# Patient Record
Sex: Male | Born: 1990 | Race: Black or African American | Hispanic: No | Marital: Single | Smoking: Current every day smoker
Health system: Southern US, Community
[De-identification: ages and names within clinical notes are randomized; demographics above are authoritative.]

---

## 2014-04-01 ENCOUNTER — Emergency Department (HOSPITAL_COMMUNITY): Payer: BC Managed Care – PPO | Admitting: Anesthesiology

## 2014-04-01 ENCOUNTER — Encounter (HOSPITAL_COMMUNITY)
Admission: EM | Disposition: A | Discharge status: Home / Self Care | Payer: Self-pay | Source: Home / Self Care | Attending: Emergency Medicine

## 2014-04-01 ENCOUNTER — Emergency Department (HOSPITAL_COMMUNITY): Payer: BC Managed Care – PPO

## 2014-04-01 ENCOUNTER — Encounter (HOSPITAL_COMMUNITY): Payer: Self-pay | Admitting: Emergency Medicine

## 2014-04-01 ENCOUNTER — Observation Stay (HOSPITAL_COMMUNITY)
Admission: EM | Admit: 2014-04-01 | Discharge: 2014-04-02 | Disposition: A | Discharge status: Home / Self Care | Payer: BC Managed Care – PPO | Attending: General Surgery | Admitting: General Surgery

## 2014-04-01 DIAGNOSIS — S31109A Unspecified open wound of abdominal wall, unspecified quadrant without penetration into peritoneal cavity, initial encounter: Secondary | ICD-10-CM | POA: Diagnosis not present

## 2014-04-01 DIAGNOSIS — T1490 Injury, unspecified: Secondary | ICD-10-CM | POA: Diagnosis present

## 2014-04-01 DIAGNOSIS — Y929 Unspecified place or not applicable: Secondary | ICD-10-CM | POA: Diagnosis not present

## 2014-04-01 DIAGNOSIS — Z720 Tobacco use: Secondary | ICD-10-CM | POA: Insufficient documentation

## 2014-04-01 DIAGNOSIS — S31139A Puncture wound of abdominal wall without foreign body, unspecified quadrant without penetration into peritoneal cavity, initial encounter: Secondary | ICD-10-CM

## 2014-04-01 DIAGNOSIS — W3400XA Accidental discharge from unspecified firearms or gun, initial encounter: Secondary | ICD-10-CM | POA: Diagnosis not present

## 2014-04-01 DIAGNOSIS — T1490XA Injury, unspecified, initial encounter: Secondary | ICD-10-CM

## 2014-04-01 HISTORY — PX: DIAGNOSTIC LAPAROSCOPIC LIVER BIOPSY: SHX5797

## 2014-04-01 HISTORY — PX: PROCTOSCOPY: SHX2266

## 2014-04-01 LAB — COMPREHENSIVE METABOLIC PANEL
ALK PHOS: 91 U/L (ref 39–117)
ALT: 75 U/L — ABNORMAL HIGH (ref 0–53)
ANION GAP: 18 — AB (ref 5–15)
AST: 51 U/L — ABNORMAL HIGH (ref 0–37)
Albumin: 3.8 g/dL (ref 3.5–5.2)
BILIRUBIN TOTAL: 0.3 mg/dL (ref 0.3–1.2)
BUN: 14 mg/dL (ref 6–23)
CO2: 22 mEq/L (ref 19–32)
Calcium: 9.1 mg/dL (ref 8.4–10.5)
Chloride: 100 mEq/L (ref 96–112)
Creatinine, Ser: 1.22 mg/dL (ref 0.50–1.35)
GFR calc Af Amer: >90 mL/min (ref >90)
GFR calc non Af Amer: 84 mL/min — ABNORMAL LOW (ref >90)
Glucose, Bld: 99 mg/dL (ref 70–99)
POTASSIUM: 3.5 meq/L — AB (ref 3.7–5.3)
Sodium: 140 mEq/L (ref 137–147)
Total Protein: 7.6 g/dL (ref 6.0–8.3)

## 2014-04-01 LAB — PREPARE FRESH FROZEN PLASMA
UNIT DIVISION: 0
Unit division: 0

## 2014-04-01 LAB — ABO/RH: ABO/RH(D): B POS

## 2014-04-01 LAB — ETHANOL: ALCOHOL ETHYL (B): 43 mg/dL — AB (ref 0–11)

## 2014-04-01 LAB — CBC
HCT: 44.6 % (ref 39.0–52.0)
Hemoglobin: 15.1 g/dL (ref 13.0–17.0)
MCH: 28.5 pg (ref 26.0–34.0)
MCHC: 33.9 g/dL (ref 30.0–36.0)
MCV: 84.3 fL (ref 78.0–100.0)
Platelets: 364 10*3/uL (ref 150–400)
RBC: 5.29 MIL/uL (ref 4.22–5.81)
RDW: 13.6 % (ref 11.5–15.5)
WBC: 10 10*3/uL (ref 4.0–10.5)

## 2014-04-01 LAB — PREPARE RBC (CROSSMATCH)

## 2014-04-01 LAB — PROTIME-INR
INR: 1 (ref 0.00–1.49)
PROTHROMBIN TIME: 13.3 s (ref 11.6–15.2)

## 2014-04-01 SURGERY — BIOPSY, LIVER, LAPAROSCOPIC
Anesthesia: General

## 2014-04-01 MED ORDER — SODIUM CHLORIDE 0.9 % IV SOLN
INTRAVENOUS | Status: DC | PRN
  Administered 2014-04-01 (×3): via INTRAVENOUS

## 2014-04-01 MED ORDER — LIDOCAINE HCL (CARDIAC) 20 MG/ML IV SOLN
INTRAVENOUS | Status: DC | PRN
  Administered 2014-04-01: 100 mg via INTRAVENOUS

## 2014-04-01 MED ORDER — KCL IN DEXTROSE-NACL 20-5-0.45 MEQ/L-%-% IV SOLN
INTRAVENOUS | Status: DC
Start: 2014-04-01 — End: 2014-04-02
  Administered 2014-04-01: 11:00:00 via INTRAVENOUS
  Filled 2014-04-01 (×4): qty 1000

## 2014-04-01 MED ORDER — HYDROMORPHONE HCL 1 MG/ML IJ SOLN
INTRAMUSCULAR | Status: AC
  Filled 2014-04-01: qty 1

## 2014-04-01 MED ORDER — FENTANYL CITRATE 0.05 MG/ML IJ SOLN
INTRAMUSCULAR | Status: AC
  Filled 2014-04-01: qty 5

## 2014-04-01 MED ORDER — DEXAMETHASONE SODIUM PHOSPHATE 4 MG/ML IJ SOLN
INTRAMUSCULAR | Status: AC
  Filled 2014-04-01: qty 2

## 2014-04-01 MED ORDER — ONDANSETRON HCL 4 MG PO TABS: 4.0000 mg | ORAL_TABLET | Freq: Four times a day (QID) | ORAL | Status: DC | PRN

## 2014-04-01 MED ORDER — ENOXAPARIN SODIUM 40 MG/0.4ML ~~LOC~~ SOLN
40.0000 mg | SUBCUTANEOUS | Status: DC
  Administered 2014-04-01: 40 mg via SUBCUTANEOUS
  Filled 2014-04-01 (×2): qty 0.4

## 2014-04-01 MED ORDER — NEOSTIGMINE METHYLSULFATE 10 MG/10ML IV SOLN
INTRAVENOUS | Status: DC | PRN
  Administered 2014-04-01: 5 mg via INTRAVENOUS

## 2014-04-01 MED ORDER — TETANUS-DIPHTH-ACELL PERTUSSIS 5-2.5-18.5 LF-MCG/0.5 IM SUSP
INTRAMUSCULAR | Status: AC
  Filled 2014-04-01: qty 0.5

## 2014-04-01 MED ORDER — OXYCODONE-ACETAMINOPHEN 5-325 MG PO TABS: 1.0000 | ORAL_TABLET | ORAL | Status: DC | PRN

## 2014-04-01 MED ORDER — SODIUM CHLORIDE 0.9 % IV BOLUS (SEPSIS)
1000.0000 mL | Freq: Once | INTRAVENOUS | Status: AC
  Administered 2014-04-01: 1000 mL via INTRAVENOUS

## 2014-04-01 MED ORDER — MIDAZOLAM HCL 2 MG/2ML IJ SOLN
INTRAMUSCULAR | Status: AC
  Filled 2014-04-01: qty 2

## 2014-04-01 MED ORDER — GLYCOPYRROLATE 0.2 MG/ML IJ SOLN
INTRAMUSCULAR | Status: DC | PRN
  Administered 2014-04-01: 0.6 mg via INTRAVENOUS

## 2014-04-01 MED ORDER — FENTANYL CITRATE 0.05 MG/ML IJ SOLN
INTRAMUSCULAR | Status: DC | PRN
  Administered 2014-04-01: 50 ug via INTRAVENOUS
  Administered 2014-04-01 (×2): 100 ug via INTRAVENOUS
  Administered 2014-04-01: 50 ug via INTRAVENOUS
  Administered 2014-04-01 (×2): 100 ug via INTRAVENOUS

## 2014-04-01 MED ORDER — PHENYLEPHRINE HCL 10 MG/ML IJ SOLN
10.0000 mg | INTRAMUSCULAR | Status: DC | PRN
  Administered 2014-04-01: 15 ug/min via INTRAVENOUS

## 2014-04-01 MED ORDER — SODIUM CHLORIDE 0.9 % IV SOLN
1.0000 g | Freq: Once | INTRAVENOUS | Status: AC
  Administered 2014-04-01: 1 g via INTRAVENOUS
  Filled 2014-04-01 (×2): qty 1

## 2014-04-01 MED ORDER — BUPIVACAINE HCL (PF) 0.25 % IJ SOLN
INTRAMUSCULAR | Status: DC | PRN
  Administered 2014-04-01: 10 mL

## 2014-04-01 MED ORDER — PROPOFOL 10 MG/ML IV BOLUS
INTRAVENOUS | Status: AC
  Filled 2014-04-01: qty 20

## 2014-04-01 MED ORDER — DEXAMETHASONE SODIUM PHOSPHATE 4 MG/ML IJ SOLN
INTRAMUSCULAR | Status: DC | PRN
  Administered 2014-04-01: 8 mg via INTRAVENOUS

## 2014-04-01 MED ORDER — HYDROMORPHONE HCL 1 MG/ML IJ SOLN
0.2500 mg | INTRAMUSCULAR | Status: DC | PRN
  Administered 2014-04-01 (×2): 0.5 mg via INTRAVENOUS

## 2014-04-01 MED ORDER — ARTIFICIAL TEARS OP OINT
TOPICAL_OINTMENT | OPHTHALMIC | Status: DC | PRN
  Administered 2014-04-01: 1 via OPHTHALMIC

## 2014-04-01 MED ORDER — ONDANSETRON HCL 4 MG/2ML IJ SOLN
INTRAMUSCULAR | Status: DC | PRN
  Administered 2014-04-01: 4 mg via INTRAVENOUS

## 2014-04-01 MED ORDER — PROPOFOL 10 MG/ML IV BOLUS
INTRAVENOUS | Status: DC | PRN
  Administered 2014-04-01: 50 mg via INTRAVENOUS
  Administered 2014-04-01: 200 mg via INTRAVENOUS

## 2014-04-01 MED ORDER — TETANUS-DIPHTH-ACELL PERTUSSIS 5-2.5-18.5 LF-MCG/0.5 IM SUSP
0.5000 mL | Freq: Once | INTRAMUSCULAR | Status: AC
  Administered 2014-04-01: 0.5 mL via INTRAMUSCULAR

## 2014-04-01 MED ORDER — ONDANSETRON HCL 4 MG/2ML IJ SOLN: 4.0000 mg | Freq: Four times a day (QID) | INTRAMUSCULAR | Status: DC | PRN

## 2014-04-01 MED ORDER — VECURONIUM BROMIDE 10 MG IV SOLR
INTRAVENOUS | Status: DC | PRN
  Administered 2014-04-01: 5 mg via INTRAVENOUS
  Administered 2014-04-01: 3 mg via INTRAVENOUS

## 2014-04-01 MED ORDER — POVIDONE-IODINE 10 % OINT PACKET
TOPICAL_OINTMENT | CUTANEOUS | Status: DC | PRN
  Administered 2014-04-01: 1 via TOPICAL

## 2014-04-01 MED ORDER — SUCCINYLCHOLINE CHLORIDE 20 MG/ML IJ SOLN
INTRAMUSCULAR | Status: DC | PRN
  Administered 2014-04-01: 120 mg via INTRAVENOUS

## 2014-04-01 MED ORDER — HYDROMORPHONE HCL 1 MG/ML IJ SOLN: 0.5000 mg | INTRAMUSCULAR | Status: DC | PRN

## 2014-04-01 SURGICAL SUPPLY — 36 items
CANISTER SUCTION 2500CC (MISCELLANEOUS) ×3 IMPLANT
CLOSURE WOUND 1/2 X4 (GAUZE/BANDAGES/DRESSINGS) ×1
COVER SURGICAL LIGHT HANDLE (MISCELLANEOUS) ×3 IMPLANT
DRAPE LAPAROSCOPIC ABDOMINAL (DRAPES) ×3 IMPLANT
DRAPE PROXIMA HALF (DRAPES) ×6 IMPLANT
DRAPE UTILITY XL STRL (DRAPES) ×3 IMPLANT
ELECT CAUTERY BLADE 6.4 (BLADE) ×3 IMPLANT
ELECT REM PT RETURN 9FT ADLT (ELECTROSURGICAL) ×3
ELECTRODE REM PT RTRN 9FT ADLT (ELECTROSURGICAL) ×1 IMPLANT
GAUZE SPONGE 4X4 12PLY STRL (GAUZE/BANDAGES/DRESSINGS) ×3 IMPLANT
GLOVE BIOGEL PI IND STRL 8 (GLOVE) ×1 IMPLANT
GLOVE BIOGEL PI INDICATOR 8 (GLOVE) ×2
GLOVE ECLIPSE 7.5 STRL STRAW (GLOVE) ×3 IMPLANT
GOWN STRL REUS W/ TWL LRG LVL3 (GOWN DISPOSABLE) ×3 IMPLANT
GOWN STRL REUS W/TWL LRG LVL3 (GOWN DISPOSABLE) ×6
HOOD SURGICAL BLUE (PROTECTIVE WEAR) ×3 IMPLANT
KIT BASIN OR (CUSTOM PROCEDURE TRAY) ×3 IMPLANT
KIT ROOM TURNOVER OR (KITS) ×3 IMPLANT
LIQUID BAND (GAUZE/BANDAGES/DRESSINGS) ×3 IMPLANT
NEEDLE HYPO 25GX1X1/2 BEV (NEEDLE) ×3 IMPLANT
PACK GENERAL/GYN (CUSTOM PROCEDURE TRAY) ×3 IMPLANT
PAD ARMBOARD 7.5X6 YLW CONV (MISCELLANEOUS) ×3 IMPLANT
SOLUTION ANTI FOG 6CC (MISCELLANEOUS) ×3 IMPLANT
SPONGE GAUZE 4X4 12PLY STER LF (GAUZE/BANDAGES/DRESSINGS) ×3 IMPLANT
STRIP CLOSURE SKIN 1/2X4 (GAUZE/BANDAGES/DRESSINGS) ×2 IMPLANT
SUCT SIGMOIDOSCOPE TIP 18 W/TU (SUCTIONS) ×3 IMPLANT
SUT MNCRL AB 4-0 PS2 18 (SUTURE) ×3 IMPLANT
SYR CONTROL 10ML LL (SYRINGE) ×3 IMPLANT
TAPE CLOTH SURG 4X10 WHT LF (GAUZE/BANDAGES/DRESSINGS) ×3 IMPLANT
TOWEL OR 17X26 10 PK STRL BLUE (TOWEL DISPOSABLE) ×3 IMPLANT
TRAY FOLEY CATH 16FRSI W/METER (SET/KITS/TRAYS/PACK) IMPLANT
TROCAR BLADELESS 5MM (ENDOMECHANICALS) ×3 IMPLANT
TROCAR XCEL BLUNT TIP 100MML (ENDOMECHANICALS) ×3 IMPLANT
TUBE CONNECTING 12'X1/4 (SUCTIONS)
TUBE CONNECTING 12X1/4 (SUCTIONS) IMPLANT
TUBING INSUFFLATION (TUBING) ×3 IMPLANT

## 2014-04-01 NOTE — Progress Notes (Signed)
UR completed 
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
## 2014-04-01 NOTE — Transfer of Care (Signed)
Immediate Anesthesia Transfer of Care Note  Patient: Carl Nelson  Procedure(s) Performed: Procedure(s): DIAGNOSTIC LAPAROSCOPY (N/A) EXPLORATORY LAPAROTOMY (N/A) PROCTOSCOPY (N/A)  Patient Location: PACU  Anesthesia Type:General  Level of Consciousness: awake, oriented, sedated, patient cooperative and responds to stimulation  Airway & Oxygen Therapy: Patient Spontanous Breathing and Patient connected to nasal cannula oxygen  Post-op Assessment: Report given to PACU RN, Post -op Vital signs reviewed and stable, Patient moving all extremities and Patient moving all extremities X 4  Post vital signs: Reviewed and stable  Complications: No apparent anesthesia complications
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
## 2014-04-01 NOTE — Anesthesia Procedure Notes (Signed)
Procedure Name: Intubation Date/Time: 04/01/2014 4:01 AM Performed by: Wray KearnsFOLEY, Carl Nelson Pre-anesthesia Checklist: Patient identified, Timeout performed, Emergency Drugs available, Suction available and Patient being monitored Patient Re-evaluated:Patient Re-evaluated prior to inductionOxygen Delivery Method: Circle system utilized Preoxygenation: Pre-oxygenation with 100% oxygen Intubation Type: IV induction, Rapid sequence and Cricoid Pressure applied Laryngoscope Size: Mac and 4 Grade View: Grade I Tube type: Oral Tube size: 7.5 mm Number of attempts: 1 Airway Equipment and Method: Stylet Placement Confirmation: ETT inserted through vocal cords under direct vision,  breath sounds checked- equal and bilateral and positive ETCO2 Secured at: 23 cm Tube secured with: Tape Dental Injury: Teeth and Oropharynx as per pre-operative assessment
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
## 2014-04-01 NOTE — Anesthesia Postprocedure Evaluation (Signed)
  Anesthesia Post-op Note  Patient: Carl Nelson  Procedure(s) Performed: Procedure(s): DIAGNOSTIC LAPAROSCOPY (N/A) PROCTOSCOPY (N/A)  Patient Location: PACU  Anesthesia Type:General  Level of Consciousness: awake  Airway and Oxygen Therapy: Patient Spontanous Breathing  Post-op Pain: mild  Post-op Assessment: Post-op Vital signs reviewed  Post-op Vital Signs: Reviewed  Last Vitals:  Filed Vitals:   04/01/14 0630  BP: 100/62  Pulse: 88  Temp: 36.6 C  Resp: 25    Complications: No apparent anesthesia complications

## 2014-04-01 NOTE — Anesthesia Preprocedure Evaluation (Signed)
Anesthesia Evaluation  Patient identified by MRN, date of birth, ID band Patient awake  General Assessment Comment:History obtained from patient. CE  Reviewed: Allergy & Precautions, H&P , NPO status , Patient's Chart, lab work & pertinent test results  Airway Mallampati: II  TM Distance: >3 FB Neck ROM: Full    Dental   Pulmonary neg pulmonary ROS, Current Smoker,  breath sounds clear to auscultation        Cardiovascular negative cardio ROS  Rhythm:Regular Rate:Normal     Neuro/Psych    GI/Hepatic negative GI ROS, Neg liver ROS,   Endo/Other    Renal/GU negative Renal ROS     Musculoskeletal   Abdominal   Peds  Hematology   Anesthesia Other Findings   Reproductive/Obstetrics                             Anesthesia Physical Anesthesia Plan  ASA: I and emergent  Anesthesia Plan: General   Post-op Pain Management:    Induction: Intravenous  Airway Management Planned: Oral ETT  Additional Equipment:   Intra-op Plan:   Post-operative Plan:   Informed Consent: I have reviewed the patients History and Physical, chart, labs and discussed the procedure including the risks, benefits and alternatives for the proposed anesthesia with the patient or authorized representative who has indicated his/her understanding and acceptance.   Dental advisory given  Plan Discussed with: CRNA, Anesthesiologist and Surgeon  Anesthesia Plan Comments:         Anesthesia Quick Evaluation
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
## 2014-04-02 ENCOUNTER — Encounter (HOSPITAL_COMMUNITY): Payer: Self-pay | Admitting: General Surgery

## 2014-04-02 LAB — CBC
HEMATOCRIT: 40.2 % (ref 39.0–52.0)
Hemoglobin: 13.6 g/dL (ref 13.0–17.0)
MCH: 28.5 pg (ref 26.0–34.0)
MCHC: 33.8 g/dL (ref 30.0–36.0)
MCV: 84.1 fL (ref 78.0–100.0)
Platelets: 333 10*3/uL (ref 150–400)
RBC: 4.78 MIL/uL (ref 4.22–5.81)
RDW: 13.7 % (ref 11.5–15.5)
WBC: 11.6 10*3/uL — AB (ref 4.0–10.5)

## 2014-04-02 LAB — BASIC METABOLIC PANEL
Anion gap: 13 (ref 5–15)
BUN: 6 mg/dL (ref 6–23)
CALCIUM: 8.5 mg/dL (ref 8.4–10.5)
CO2: 22 mEq/L (ref 19–32)
CREATININE: 0.96 mg/dL (ref 0.50–1.35)
Chloride: 103 mEq/L (ref 96–112)
GFR calc Af Amer: >90 mL/min (ref >90)
GFR calc non Af Amer: >90 mL/min (ref >90)
GLUCOSE: 101 mg/dL — AB (ref 70–99)
Potassium: 3.8 mEq/L (ref 3.7–5.3)
Sodium: 138 mEq/L (ref 137–147)

## 2014-04-02 MED ORDER — OXYCODONE-ACETAMINOPHEN 5-325 MG PO TABS: 1.0000 | ORAL_TABLET | Freq: Four times a day (QID) | ORAL | Status: DC | PRN

## 2014-04-02 NOTE — Progress Notes (Signed)
Discharge home. Home discharge instruction given, no questions verbalized. 

## 2014-04-02 NOTE — Progress Notes (Signed)
UR completed 
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
## 2014-04-03 LAB — TYPE AND SCREEN
ABO/RH(D): B POS
Antibody Screen: NEGATIVE
UNIT DIVISION: 0
UNIT DIVISION: 0
UNIT DIVISION: 0
Unit division: 0
Unit division: 0
Unit division: 0

## 2017-10-10 ENCOUNTER — Other Ambulatory Visit
Admission: AD | Admit: 2017-10-10 | Discharge: 2017-10-10 | Disposition: A | Discharge status: Home / Self Care | Attending: Family Medicine | Admitting: Family Medicine
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
## 2018-01-01 ENCOUNTER — Other Ambulatory Visit
Admission: AD | Admit: 2018-01-01 | Discharge: 2018-01-01 | Disposition: A | Discharge status: Home / Self Care | Attending: Family Medicine | Admitting: Family Medicine
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
## 2018-11-29 ENCOUNTER — Emergency Department: Payer: Self-pay

## 2018-11-29 ENCOUNTER — Inpatient Hospital Stay
Admission: EM | Admit: 2018-11-29 | Discharge: 2018-12-04 | DRG: 392 | Disposition: A | Discharge status: Home / Self Care | Payer: Self-pay | Attending: Surgery | Admitting: Surgery

## 2018-11-29 ENCOUNTER — Encounter: Payer: Self-pay | Admitting: Emergency Medicine

## 2018-11-29 DIAGNOSIS — E669 Obesity, unspecified: Secondary | ICD-10-CM | POA: Diagnosis present

## 2018-11-29 DIAGNOSIS — K5900 Constipation, unspecified: Secondary | ICD-10-CM | POA: Diagnosis present

## 2018-11-29 DIAGNOSIS — Z20828 Contact with and (suspected) exposure to other viral communicable diseases: Secondary | ICD-10-CM | POA: Diagnosis present

## 2018-11-29 DIAGNOSIS — K5720 Diverticulitis of large intestine with perforation and abscess without bleeding: Principal | ICD-10-CM | POA: Diagnosis present

## 2018-11-29 DIAGNOSIS — F17210 Nicotine dependence, cigarettes, uncomplicated: Secondary | ICD-10-CM | POA: Diagnosis present

## 2018-11-29 DIAGNOSIS — Z6841 Body Mass Index (BMI) 40.0 and over, adult: Secondary | ICD-10-CM

## 2018-11-29 DIAGNOSIS — R188 Other ascites: Secondary | ICD-10-CM

## 2018-11-29 DIAGNOSIS — K5732 Diverticulitis of large intestine without perforation or abscess without bleeding: Secondary | ICD-10-CM | POA: Diagnosis present

## 2018-11-29 DIAGNOSIS — R109 Unspecified abdominal pain: Secondary | ICD-10-CM

## 2018-11-29 LAB — CBC
HCT: 45.6 % (ref 39.0–52.0)
Hemoglobin: 15 g/dL (ref 13.0–17.0)
MCH: 28.5 pg (ref 26.0–34.0)
MCHC: 32.9 g/dL (ref 30.0–36.0)
MCV: 86.7 fL (ref 80.0–100.0)
Platelets: 529 10*3/uL — ABNORMAL HIGH (ref 150–400)
RBC: 5.26 MIL/uL (ref 4.22–5.81)
RDW: 14.2 % (ref 11.5–15.5)
WBC: 32.3 10*3/uL — ABNORMAL HIGH (ref 4.0–10.5)
nRBC: 0 % (ref 0.0–0.2)

## 2018-11-29 LAB — URINALYSIS, COMPLETE (UACMP) WITH MICROSCOPIC
Bacteria, UA: NONE SEEN
Glucose, UA: NEGATIVE mg/dL
Ketones, ur: 80 mg/dL — AB
Leukocytes,Ua: NEGATIVE
Nitrite: NEGATIVE
Protein, ur: 100 mg/dL — AB
Specific Gravity, Urine: 1.028 (ref 1.005–1.030)
pH: 6 (ref 5.0–8.0)

## 2018-11-29 LAB — COMPREHENSIVE METABOLIC PANEL
ALT: 23 U/L (ref 0–44)
AST: 15 U/L (ref 15–41)
Albumin: 3.8 g/dL (ref 3.5–5.0)
Alkaline Phosphatase: 85 U/L (ref 38–126)
Anion gap: 12 (ref 5–15)
BUN: 7 mg/dL (ref 6–20)
CO2: 26 mmol/L (ref 22–32)
Calcium: 9 mg/dL (ref 8.9–10.3)
Chloride: 95 mmol/L — ABNORMAL LOW (ref 98–111)
Creatinine, Ser: 0.99 mg/dL (ref 0.61–1.24)
GFR calc Af Amer: >60 mL/min (ref >60)
GFR calc non Af Amer: >60 mL/min (ref >60)
Glucose, Bld: 100 mg/dL — ABNORMAL HIGH (ref 70–99)
Potassium: 3.9 mmol/L (ref 3.5–5.1)
Sodium: 133 mmol/L — ABNORMAL LOW (ref 135–145)
Total Bilirubin: 0.7 mg/dL (ref 0.3–1.2)
Total Protein: 8.3 g/dL — ABNORMAL HIGH (ref 6.5–8.1)

## 2018-11-29 LAB — LIPASE, BLOOD: Lipase: 15 U/L (ref 11–51)

## 2018-11-29 MED ORDER — SODIUM CHLORIDE 0.9 % IV BOLUS
1000.0000 mL | Freq: Once | INTRAVENOUS | Status: AC
  Administered 2018-11-29: 21:00:00 1000 mL via INTRAVENOUS

## 2018-11-29 MED ORDER — ACETAMINOPHEN 500 MG PO TABS
1000.0000 mg | ORAL_TABLET | Freq: Once | ORAL | Status: AC
  Administered 2018-11-29: 1000 mg via ORAL
  Filled 2018-11-29: qty 2

## 2018-11-29 MED ORDER — PIPERACILLIN-TAZOBACTAM 3.375 G IVPB 30 MIN
3.3750 g | Freq: Once | INTRAVENOUS | Status: AC
  Administered 2018-11-29: 22:00:00 3.375 g via INTRAVENOUS
  Filled 2018-11-29: qty 50

## 2018-11-29 MED ORDER — IOHEXOL 300 MG/ML  SOLN
125.0000 mL | Freq: Once | INTRAMUSCULAR | Status: AC | PRN
  Administered 2018-11-29: 21:00:00 125 mL via INTRAVENOUS

## 2018-11-29 NOTE — ED Triage Notes (Signed)
C/o lower abdominal pain, bloating x1 week as well as "bladder pain." Pt denies urinary frequency. Pt has had N and 1 episode of emesis today. Pt reports he took laxative x3 days ago with no relief of pain and bloating.

## 2018-11-29 NOTE — ED Notes (Signed)
Patient returned from CT

## 2018-11-29 NOTE — ED Notes (Signed)
Used another thermometer due to inaccuracy of first
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
## 2018-11-30 ENCOUNTER — Other Ambulatory Visit: Payer: Self-pay

## 2018-11-30 ENCOUNTER — Inpatient Hospital Stay: Payer: Self-pay

## 2018-11-30 LAB — CBC
HCT: 41.8 % (ref 39.0–52.0)
Hemoglobin: 13.9 g/dL (ref 13.0–17.0)
MCH: 28.5 pg (ref 26.0–34.0)
MCHC: 33.3 g/dL (ref 30.0–36.0)
MCV: 85.8 fL (ref 80.0–100.0)
Platelets: 496 10*3/uL — ABNORMAL HIGH (ref 150–400)
RBC: 4.87 MIL/uL (ref 4.22–5.81)
RDW: 14.3 % (ref 11.5–15.5)
WBC: 27.2 10*3/uL — ABNORMAL HIGH (ref 4.0–10.5)
nRBC: 0 % (ref 0.0–0.2)

## 2018-11-30 LAB — BASIC METABOLIC PANEL
Anion gap: 13 (ref 5–15)
BUN: 8 mg/dL (ref 6–20)
CO2: 22 mmol/L (ref 22–32)
Calcium: 8.7 mg/dL — ABNORMAL LOW (ref 8.9–10.3)
Chloride: 101 mmol/L (ref 98–111)
Creatinine, Ser: 0.88 mg/dL (ref 0.61–1.24)
GFR calc Af Amer: >60 mL/min (ref >60)
GFR calc non Af Amer: >60 mL/min (ref >60)
Glucose, Bld: 91 mg/dL (ref 70–99)
Potassium: 3.5 mmol/L (ref 3.5–5.1)
Sodium: 136 mmol/L (ref 135–145)

## 2018-11-30 LAB — SARS CORONAVIRUS 2 BY RT PCR (HOSPITAL ORDER, PERFORMED IN ~~LOC~~ HOSPITAL LAB): SARS Coronavirus 2: NEGATIVE

## 2018-11-30 LAB — MAGNESIUM: Magnesium: 2.2 mg/dL (ref 1.7–2.4)

## 2018-11-30 LAB — PHOSPHORUS: Phosphorus: 3.6 mg/dL (ref 2.5–4.6)

## 2018-11-30 MED ORDER — FENTANYL CITRATE (PF) 100 MCG/2ML IJ SOLN
INTRAMUSCULAR | Status: AC | PRN
  Administered 2018-11-30: 25 ug via INTRAVENOUS
  Administered 2018-11-30: 50 ug via INTRAVENOUS
  Administered 2018-11-30 (×2): 25 ug via INTRAVENOUS

## 2018-11-30 MED ORDER — ACETAMINOPHEN 325 MG PO TABS
650.0000 mg | ORAL_TABLET | Freq: Four times a day (QID) | ORAL | Status: DC | PRN
  Administered 2018-11-30 – 2018-12-01 (×2): 650 mg via ORAL
  Filled 2018-11-30 (×2): qty 2

## 2018-11-30 MED ORDER — PIPERACILLIN-TAZOBACTAM 3.375 G IVPB
3.3750 g | Freq: Three times a day (TID) | INTRAVENOUS | Status: DC
  Administered 2018-11-30 – 2018-12-04 (×13): 3.375 g via INTRAVENOUS
  Filled 2018-11-30 (×13): qty 50

## 2018-11-30 MED ORDER — MIDAZOLAM HCL 5 MG/5ML IJ SOLN
INTRAMUSCULAR | Status: AC | PRN
  Administered 2018-11-30: 1 mg via INTRAVENOUS
  Administered 2018-11-30: 2 mg via INTRAVENOUS
  Administered 2018-11-30: 1 mg via INTRAVENOUS

## 2018-11-30 MED ORDER — HYDROCODONE-ACETAMINOPHEN 5-325 MG PO TABS: 1.0000 | ORAL_TABLET | ORAL | Status: DC | PRN

## 2018-11-30 MED ORDER — FENTANYL CITRATE (PF) 100 MCG/2ML IJ SOLN
INTRAMUSCULAR | Status: AC
  Filled 2018-11-30: qty 2

## 2018-11-30 MED ORDER — SODIUM CHLORIDE 0.9% FLUSH
5.0000 mL | Freq: Three times a day (TID) | INTRAVENOUS | Status: DC
  Administered 2018-11-30 – 2018-12-03 (×7): 5 mL

## 2018-11-30 MED ORDER — DOCUSATE SODIUM 100 MG PO CAPS: 100.0000 mg | ORAL_CAPSULE | Freq: Two times a day (BID) | ORAL | Status: DC | PRN

## 2018-11-30 MED ORDER — ONDANSETRON HCL 4 MG/2ML IJ SOLN: 4.0000 mg | Freq: Four times a day (QID) | INTRAMUSCULAR | Status: DC | PRN

## 2018-11-30 MED ORDER — DIPHENHYDRAMINE HCL 25 MG PO CAPS: 25.0000 mg | ORAL_CAPSULE | Freq: Four times a day (QID) | ORAL | Status: DC | PRN

## 2018-11-30 MED ORDER — HYDROCODONE-ACETAMINOPHEN 5-325 MG PO TABS
1.0000 | ORAL_TABLET | ORAL | Status: DC | PRN
  Administered 2018-11-30: 1 via ORAL
  Administered 2018-11-30: 2 via ORAL
  Administered 2018-11-30 – 2018-12-02 (×5): 1 via ORAL
  Administered 2018-12-03 – 2018-12-04 (×4): 2 via ORAL
  Filled 2018-11-30: qty 2
  Filled 2018-11-30: qty 1
  Filled 2018-11-30: qty 2
  Filled 2018-11-30 (×4): qty 1
  Filled 2018-11-30: qty 2
  Filled 2018-11-30: qty 1
  Filled 2018-11-30 (×2): qty 2
  Filled 2018-11-30: qty 1

## 2018-11-30 MED ORDER — MIDAZOLAM HCL 5 MG/5ML IJ SOLN
INTRAMUSCULAR | Status: AC
  Filled 2018-11-30: qty 5

## 2018-11-30 MED ORDER — MORPHINE SULFATE (PF) 2 MG/ML IV SOLN: 2.0000 mg | INTRAVENOUS | Status: DC | PRN

## 2018-11-30 MED ORDER — ONDANSETRON 4 MG PO TBDP
4.0000 mg | ORAL_TABLET | Freq: Four times a day (QID) | ORAL | Status: DC | PRN
  Filled 2018-11-30: qty 1

## 2018-11-30 MED ORDER — ONDANSETRON HCL 4 MG/2ML IJ SOLN
INTRAMUSCULAR | Status: AC
  Filled 2018-11-30: qty 2

## 2018-11-30 MED ORDER — TRAMADOL HCL 50 MG PO TABS
50.0000 mg | ORAL_TABLET | Freq: Four times a day (QID) | ORAL | Status: DC | PRN
  Administered 2018-12-01 – 2018-12-02 (×2): 50 mg via ORAL
  Filled 2018-11-30 (×2): qty 1

## 2018-11-30 NOTE — H&P (Addendum)
Subjective:   CC: Sigmoid diverticulitis  HPI:  Carl Nelson is a 28 y.o. male who was consulted by Archie Balboa for issue above.  Symptoms were first noted 1 week ago. Pain is sharp, intermittent, waxing and waning, located in the left lower quadrant and suprapubic region.  Associated with nothing specific, exacerbated by nothing specific   Past Medical History: None reported  Past Surgical History:  Past Surgical History:  Procedure Laterality Date  . DIAGNOSTIC LAPAROSCOPIC LIVER BIOPSY N/A 04/01/2014   Procedure: DIAGNOSTIC LAPAROSCOPY;  Surgeon: Doreen Salvage, MD;  Location: [facility];  Service: General;  Laterality: N/A;  . PROCTOSCOPY N/A 04/01/2014   Procedure: PROCTOSCOPY;  Surgeon: Doreen Salvage, MD;  Location: [place];  Service: General;  Laterality: N/A;    Family History: Reviewed and not relevant to chief complaint  Social History:  reports that he has been smoking cigarettes. He has been smoking about 0.25 packs per day. He has never used smokeless tobacco. He reports current alcohol use. He reports that he does not use drugs.  Current Medications:  Medications Prior to Admission  Medication Sig Dispense Refill  . acetaminophen (TYLENOL) 325 MG tablet Take 650 mg by mouth every 6 (six) hours as needed.      Allergies:  Allergies as of 11/29/2018  . (No Known Allergies)    ROS:  General: Denies weight loss, weight gain, fatigue, fevers, chills, and night sweats. Eyes: Denies blurry vision, double vision, eye pain, itchy eyes, and tearing. Ears: Denies hearing loss, earache, and ringing in ears. Nose: Denies sinus pain, congestion, infections, runny nose, and nosebleeds. Mouth/throat: Denies hoarseness, sore throat, bleeding gums, and difficulty swallowing. Heart: Denies chest pain, palpitations, racing heart, irregular heartbeat, leg pain or swelling, and decreased activity tolerance. Respiratory: Denies breathing difficulty, shortness of breath, wheezing, cough, and  sputum. GI: Denies change in appetite, heartburn, nausea, vomiting, constipation, diarrhea, and blood in stool. GU: Denies difficulty urinating, pain with urinating, urgency, frequency, blood in urine. Musculoskeletal: Denies joint stiffness, pain, swelling, muscle weakness. Skin: Denies rash, itching, mass, tumors, sores, and boils Neurologic: Denies headache, fainting, dizziness, seizures, numbness, and tingling. Psychiatric: Denies depression, anxiety, difficulty sleeping, and memory loss. Endocrine: Denies heat or cold intolerance, and increased thirst or urination. Blood/lymph: Denies easy bruising, easy bruising, and swollen glands     Objective:     BP 132/68 (BP Location: Left Arm)   Pulse 86   Temp 98.8 F (37.1 C) (Oral)   Resp 20   Ht 5\' 1"  (1.549 m)   Wt 117.5 kg   SpO2 99%   BMI 48.96 kg/m   Constitutional :  alert, cooperative, appears stated age and no distress  Lymphatics/Throat:  no asymmetry, masses, or scars  Respiratory:  clear to auscultation bilaterally  Cardiovascular:  regular rate and rhythm  Gastrointestinal: Soft, no guarding, nondistended, but focal tenderness noted in left lower quadrant and suprapubic region.   Musculoskeletal: Steady gait and movement  Skin: Cool and moist,    Psychiatric: Normal affect, non-agitated, not confused       LABS:  CMP Latest Ref Rng & Units 11/29/2018 04/02/2014 04/01/2014  Glucose 70 - 99 mg/dL 100(H) 101(H) 99  BUN 6 - 20 mg/dL 7 6 14   Creatinine 0.61 - 1.24 mg/dL 0.99 0.96 1.22  Sodium 135 - 145 mmol/L 133(L) 138 140  Potassium 3.5 - 5.1 mmol/L 3.9 3.8 3.5(L)  Chloride 98 - 111 mmol/L 95(L) 103 100  CO2 22 - 32 mmol/L 26 22 22  Calcium 8.9 - 10.3 mg/dL 9.0 8.5 9.1  Total Protein 6.5 - 8.1 g/dL 8.3(H) - 7.6  Total Bilirubin 0.3 - 1.2 mg/dL 0.7 - 0.3  Alkaline Phos 38 - 126 U/L 85 - 91  AST 15 - 41 U/L 15 - 51(H)  ALT 0 - 44 U/L 23 - 75(H)   CBC Latest Ref Rng & Units 11/29/2018 04/02/2014 04/01/2014  WBC  4.0 - 10.5 K/uL 32.3(H) 11.6(H) 10.0  Hemoglobin 13.0 - 17.0 g/dL 16.115.0 09.613.6 04.515.1  Hematocrit 39.0 - 52.0 % 45.6 40.2 44.6  Platelets 150 - 400 K/uL 529(H) 333 364    RADS: CLINICAL DATA:  28 year old male with lower abdominal pain and bloating x1 week.  EXAM: CT ABDOMEN AND PELVIS WITH CONTRAST  TECHNIQUE: Multidetector CT imaging of the abdomen and pelvis was performed using the standard protocol following bolus administration of intravenous contrast.  CONTRAST:  125mL OMNIPAQUE IOHEXOL 300 MG/ML  SOLN  COMPARISON:  None.  FINDINGS: Lower chest: The visualized lung bases are clear.  No intra-abdominal free air. Trace free fluid may be present within the pelvis.  Hepatobiliary: Probable mild fatty infiltration of the liver. No intrahepatic biliary ductal dilatation. The gallbladder is unremarkable.  Pancreas: Unremarkable. No pancreatic ductal dilatation or surrounding inflammatory changes.  Spleen: Normal in size without focal abnormality.  Adrenals/Urinary Tract: The adrenal glands are unremarkable. There is no hydronephrosis on either side. The visualized ureters and urinary bladder are grossly unremarkable.  Stomach/Bowel: There is thickened and inflammatory changes of the sigmoid colon. There is a 5.0 x 4.7 x 6.6 cm extraluminal collection containing air and fluid adjacent to the sigmoid colon most consistent with perforation. Additional smaller extraluminal air noted. Findings likely related to a perforated diverticulitis. Evaluation however is limited due to respiratory motion. This fluid collection does not appear to have organized wall on these images. A small diverticula is noted in the distal descending colon. There is no bowel obstruction. There is liquid stool throughout the colon compatible with diarrheal state. The appendix appears unremarkable (axial series 2 images 48-58).  Vascular/Lymphatic: The abdominal aorta and IVC are  unremarkable. No portal venous gas. There is no adenopathy.  Reproductive: The prostate and seminal vesicles are grossly unremarkable. No pelvic mass.  Other: None  Musculoskeletal: No acute or significant osseous findings.  IMPRESSION: 1. Findings most suggestive of perforated sigmoid diverticulitis with a 5.0 x 4.7 x 6.6 cm extraluminal collection of air and fluid. Evaluation is limited due to respiratory motion. No bowel obstruction. Normal appendix. 2. Diarrheal state. Correlation with clinical exam and stool cultures recommended.  These results were called by telephone at the time of interpretation on 11/29/2018 at 9:46 pm to Dr. Phineas SemenGRAYDON GOODMAN , who verbally acknowledged these results.   Electronically Signed   By: Elgie CollardArash  Radparvar M.D.   On: 11/29/2018 21:47 Assessment:   Sigmoid diverticulitis, likely abscess  Plan:   We will admit patient for IV antibiotics, IV fluids, n.p.o. status until discussion on CT imaging with interventional radiology for possible drain placement.  Would like to avoid surgery if at all possible during this admission since patient is minimally symptomatic at this time, despite the worrisome findings on the CT scan and the high leukocytosis.  Explained to patient that if we proceed with surgery at this point he will likely need a colostomy temporarily.  Patient verbalized understanding and agrees with plan.  Hold DVT prophylaxis for now as well until case discussed with IR.

## 2018-11-30 NOTE — Progress Notes (Signed)
Subjective:  CC: Carl Nelson is a 28 y.o. male  Hospital stay day 1,   sigmoid diverticulitis  HPI: No acute issues. Still having pain in lower abdomen. Complains of some possible issues with voiding completely. S/p IR guided drainage, developed some RUQ pain, better now after some pain meds.  ROS:  General: Denies weight loss, weight gain, fatigue, fevers, chills, and night sweats. Heart: Denies chest pain, palpitations, racing heart, irregular heartbeat, leg pain or swelling, and decreased activity tolerance. Respiratory: Denies breathing difficulty, shortness of breath, wheezing, cough, and sputum. GI: Denies change in appetite, heartburn, nausea, vomiting, constipation, diarrhea, and blood in stool. GU: Denies pain with urinating, urgency, frequency, blood in urine.   Objective:   Temp:  [98.8 F (37.1 C)-101.7 F (38.7 C)] 98.9 F (37.2 C) (08/12 0527) Pulse Rate:  [86-120] 105 (08/12 0527) Resp:  [18-20] 20 (08/12 0527) BP: (123-158)/(67-105) 127/88 (08/12 0527) SpO2:  [96 %-100 %] 99 % (08/12 0527) Weight:  [117.5 kg-124.7 kg] 117.5 kg (08/12 0109)     Height: 5\' 1"  (154.9 cm) Weight: 117.5 kg BMI (Calculated): 48.98   Intake/Output this shift:   Intake/Output Summary (Last 24 hours) at 11/30/2018 0803 Last data filed at 11/30/2018 0600 Gross per 24 hour  Intake 9.48 ml  Output -  Net 9.48 ml    Constitutional :  alert, cooperative, appears stated age and no distress  Respiratory:  clear to auscultation bilaterally  Cardiovascular:  regular rate and rhythm  Gastrointestinal: soft, no guarding, focal tenderness in LLQ and RUQ now, possibel murphy's sign?  remaining qudarants non-tender.   Skin: Cool and moist. IR drain with feculant drainage.  Psychiatric: Normal affect, non-agitated, not confused       LABS:  CMP Latest Ref Rng & Units 11/30/2018 11/29/2018 04/02/2014  Glucose 70 - 99 mg/dL 91 100(H) 101(H)  BUN 6 - 20 mg/dL 8 7 6   Creatinine 0.61 - 1.24 mg/dL  0.88 0.99 0.96  Sodium 135 - 145 mmol/L 136 133(L) 138  Potassium 3.5 - 5.1 mmol/L 3.5 3.9 3.8  Chloride 98 - 111 mmol/L 101 95(L) 103  CO2 22 - 32 mmol/L 22 26 22   Calcium 8.9 - 10.3 mg/dL 8.7(L) 9.0 8.5  Total Protein 6.5 - 8.1 g/dL - 8.3(H) -  Total Bilirubin 0.3 - 1.2 mg/dL - 0.7 -  Alkaline Phos 38 - 126 U/L - 85 -  AST 15 - 41 U/L - 15 -  ALT 0 - 44 U/L - 23 -   CBC Latest Ref Rng & Units 11/30/2018 11/29/2018 04/02/2014  WBC 4.0 - 10.5 K/uL 27.2(H) 32.3(H) 11.6(H)  Hemoglobin 13.0 - 17.0 g/dL 13.9 15.0 13.6  Hematocrit 39.0 - 52.0 % 41.8 45.6 40.2  Platelets 150 - 400 K/uL 496(H) 529(H) 333    RADS: n/a Assessment:   Sigmoid diverticulitis.   - s/p IR drainage.  Continue to monitor hopefully for improvement  Urinary retention? - bladder scan to confirm, could also just be irritation from diverticulitis  New onset RUQ pain? -vitals stable, maintaing O2 sat above 95% on RA.  Pt overall looks comfortable as long as no palpation of abdomen.  Unsure etiology but will continue to monitor for now.
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
## 2018-11-30 NOTE — Procedures (Signed)
  Procedure: CT LLQ abscess drain placement 72f 54ml purulent for GS, C&S EBL:   minimal Complications:  none immediate  See full dictation in [facility].  Dillard Cannon MD Main # [phone] Pager  [phone]

## 2018-11-30 NOTE — ED Notes (Signed)
ED TO INPATIENT HANDOFF REPORT  ED Nurse Name and Phone #:  Madelon LipsJen  S Name/Age/Gender Carl Nelson 28 y.o. male Room/Bed: ED05A/ED05A  Code Status   Code Status: Prior  Home/SNF/Other Home Patient oriented to: self, place, time and situation Is this baseline? Yes   Triage Complete: Triage complete  Chief Complaint Severe abd pain  Triage Note C/o lower abdominal pain, bloating x1 week as well as "bladder pain." Pt denies urinary frequency. Pt has had N and 1 episode of emesis today. Pt reports he took laxative x3 days ago with no relief of pain and bloating.    Allergies No Known Allergies  Level of Care/Admitting Diagnosis ED Disposition    ED Disposition Condition Comment   Admit  [facility] [[id number]]  Level of Care: Med-Surg [16]  Covid Evaluation: Asymptomatic Screening Protocol (No Symptoms)  Diagnosis: Diverticulitis large intestine [[id number]][[id number]]  Admitting Physician: Sung AmabileSAKAI, ISAMI [[id number]]  Attending Physician: Sung AmabileSAKAI, ISAMI [[id number]]  Estimated length of stay: 3 - 4 days  Certification:: I certify this patient will need inpatient services for at least 2 midnights  PT Class (Do Not Modify): Inpatient [101]  PT Acc Code (Do Not Modify): Private [1]       B Medical/Surgery History History reviewed. No pertinent past medical history. Past Surgical History:  Procedure Laterality Date  . DIAGNOSTIC LAPAROSCOPIC LIVER BIOPSY N/A 04/01/2014   Procedure: DIAGNOSTIC LAPAROSCOPY;  Surgeon: Frederik SchmidtJay Wyatt, MD;  [facility];  Service: General;  Laterality: N/A;  . PROCTOSCOPY N/A 04/01/2014   Procedure: PROCTOSCOPY;  Surgeon: Frederik SchmidtJay Wyatt, MD;  [facility];  Service: General;  Laterality: N/A;     A IV Location/Drains/Wounds Patient Lines/Drains/Airways Status   Active Line/Drains/Airways    Name:   Placement date:   Placement time:   Site:   Days:   Peripheral IV 11/29/18 Right Antecubital   11/29/18    2104    Antecubital   1   Incision - 2 Ports Abdomen Umbilicus Left;Lower   04/01/14    -     1704          Intake/Output Last 24 hours No intake or output data in the 24 hours ending 11/30/18 0032  Labs/Imaging Results for orders placed or performed during the hospital encounter of 11/29/18 (from the past 48 hour(s))  Lipase, blood     Status: None   Collection Time: 11/29/18  7:19 PM  Result Value Ref Range   Lipase 15 11 - 51 U/L    Comment: Performed at [facility], [address] [place], [place] [address]  Comprehensive metabolic panel     Status: Abnormal   Collection Time: 11/29/18  7:19 PM  Result Value Ref Range   Sodium 133 (L) 135 - 145 mmol/L   Potassium 3.9 3.5 - 5.1 mmol/L   Chloride 95 (L) 98 - 111 mmol/L   CO2 26 22 - 32 mmol/L   Glucose, Bld 100 (H) 70 - 99 mg/dL   BUN 7 6 - 20 mg/dL   Creatinine, Ser 6.570.99 0.61 - 1.24 mg/dL   Calcium 9.0 8.9 - 84.610.3 mg/dL   Total Protein 8.3 (H) 6.5 - 8.1 g/dL   Albumin 3.8 3.5 - 5.0 g/dL   AST 15 15 - 41 U/L   ALT 23 0 - 44 U/L   Alkaline Phosphatase 85 38 - 126 U/L   Total Bilirubin 0.7 0.3 - 1.2 mg/dL   GFR calc non Af Amer >60 >60  mL/min   GFR calc Af Amer >60 >60 mL/min   Anion gap 12 5 - 15    Comment: Performed at [facility], [address] [place], [place] [address]  CBC     Status: Abnormal   Collection Time: 11/29/18  7:19 PM  Result Value Ref Range   WBC 32.3 (H) 4.0 - 10.5 K/uL   RBC 5.26 4.22 - 5.81 MIL/uL   Hemoglobin 15.0 13.0 - 17.0 g/dL   HCT 62.145.6 30.839.0 - 65.752.0 %   MCV 86.7 80.0 - 100.0 fL   MCH 28.5 26.0 - 34.0 pg   MCHC 32.9 30.0 - 36.0 g/dL   RDW 84.614.2 96.211.5 - 95.215.5 %   Platelets 529 (H) 150 - 400 K/uL   nRBC 0.0 0.0 - 0.2 %    Comment: Performed at [facility], [address] [place], [place] [address]  Urinalysis, Complete w Microscopic     Status: Abnormal   Collection Time: 11/29/18  7:19 PM  Result Value Ref Range   Color, Urine YELLOW (A) YELLOW   APPearance HAZY (A) CLEAR    Specific Gravity, Urine 1.028 1.005 - 1.030   pH 6.0 5.0 - 8.0   Glucose, UA NEGATIVE NEGATIVE mg/dL   Hgb urine dipstick SMALL (A) NEGATIVE   Bilirubin Urine SMALL (A) NEGATIVE   Ketones, ur 80 (A) NEGATIVE mg/dL   Protein, ur 440100 (A) NEGATIVE mg/dL   Nitrite NEGATIVE NEGATIVE   Leukocytes,Ua NEGATIVE NEGATIVE   RBC / HPF 11-20 0 - 5 RBC/hpf   WBC, UA 0-5 0 - 5 WBC/hpf   Bacteria, UA NONE SEEN NONE SEEN   Squamous Epithelial / LPF 0-5 0 - 5   Mucus PRESENT     Comment: Performed at [facility], [address] [place], [place] [address]  SARS Coronavirus 2 [facility](Hospital order, Performed in [facility]) Nasopharyngeal Nasopharyngeal Swab     Status: None   Collection Time: 11/29/18 11:25 PM   Specimen: Nasopharyngeal Swab  Result Value Ref Range   SARS Coronavirus 2 NEGATIVE NEGATIVE    Comment: (NOTE) If result is NEGATIVE SARS-CoV-2 target nucleic acids are NOT DETECTED. The SARS-CoV-2 RNA is generally detectable in upper and lower  respiratory specimens during the acute phase of infection. The lowest  concentration of SARS-CoV-2 viral copies this assay can detect is 250  copies / mL. A negative result does not preclude SARS-CoV-2 infection  and should not be used as the sole basis for treatment or other  patient management decisions.  A negative result may occur with  improper specimen collection / handling, submission of specimen other  than nasopharyngeal swab, presence of viral mutation(s) within the  areas targeted by this assay, and inadequate number of viral copies  (<250 copies / mL). A negative result must be combined with clinical  observations, patient history, and epidemiological information. If result is POSITIVE SARS-CoV-2 target nucleic acids are DETECTED. The SARS-CoV-2 RNA is generally detectable in upper and lower  respiratory specimens dur ing the acute phase of infection.  Positive  results are indicative of active infection with  SARS-CoV-2.  Clinical  correlation with patient history and other diagnostic information is  necessary to determine patient infection status.  Positive results do  not rule out bacterial infection or co-infection with other viruses. If result is PRESUMPTIVE POSTIVE SARS-CoV-2 nucleic acids MAY BE PRESENT.   A presumptive positive result was obtained on the submitted specimen  and confirmed on repeat  testing.  While 2019 novel coronavirus  (SARS-CoV-2) nucleic acids may be present in the submitted sample  additional confirmatory testing may be necessary for epidemiological  and / or clinical management purposes  to differentiate between  SARS-CoV-2 and other Sarbecovirus currently known to infect humans.  If clinically indicated additional testing with an alternate test  methodology [phone]([id number]) is advised. The SARS-CoV-2 RNA is generally  detectable in upper and lower respiratory sp ecimens during the acute  phase of infection. The expected result is Negative. Fact Sheet for Patients:  BoilerBrush.com.cyhttps://www.fda.gov/media/136312/download Fact Sheet for Healthcare Providers: https://pope.com/https://www.fda.gov/media/136313/download This test is not yet approved or cleared by the [place] FDA and has been authorized for detection and/or diagnosis of SARS-CoV-2 by FDA under an Emergency Use Authorization (EUA).  This EUA will remain in effect (meaning this test can be used) for the duration of the COVID-19 declaration under Section 564(b)(1) of the Act, 21 U.S.C. section 360bbb-3(b)(1), unless the authorization is terminated or revoked sooner. Performed at [facility], [address] [place], [place] [address]    Ct Abdomen Pelvis W Contrast  Result Date: 11/29/2018 CLINICAL DATA:  28 year old male with lower abdominal pain and bloating x1 week. EXAM: CT ABDOMEN AND PELVIS WITH CONTRAST TECHNIQUE: Multidetector CT imaging of the abdomen and pelvis was performed using the standard protocol following  bolus administration of intravenous contrast. CONTRAST:  125mL OMNIPAQUE IOHEXOL 300 MG/ML  SOLN COMPARISON:  None. FINDINGS: Lower chest: The visualized lung bases are clear. No intra-abdominal free air. Trace free fluid may be present within the pelvis. Hepatobiliary: Probable mild fatty infiltration of the liver. No intrahepatic biliary ductal dilatation. The gallbladder is unremarkable. Pancreas: Unremarkable. No pancreatic ductal dilatation or surrounding inflammatory changes. Spleen: Normal in size without focal abnormality. Adrenals/Urinary Tract: The adrenal glands are unremarkable. There is no hydronephrosis on either side. The visualized ureters and urinary bladder are grossly unremarkable. Stomach/Bowel: There is thickened and inflammatory changes of the sigmoid colon. There is a 5.0 x 4.7 x 6.6 cm extraluminal collection containing air and fluid adjacent to the sigmoid colon most consistent with perforation. Additional smaller extraluminal air noted. Findings likely related to a perforated diverticulitis. Evaluation however is limited due to respiratory motion. This fluid collection does not appear to have organized wall on these images. A small diverticula is noted in the distal descending colon. There is no bowel obstruction. There is liquid stool throughout the colon compatible with diarrheal state. The appendix appears unremarkable (axial series 2 images 48-58). Vascular/Lymphatic: The abdominal aorta and IVC are unremarkable. No portal venous gas. There is no adenopathy. Reproductive: The prostate and seminal vesicles are grossly unremarkable. No pelvic mass. Other: None Musculoskeletal: No acute or significant osseous findings. IMPRESSION: 1. Findings most suggestive of perforated sigmoid diverticulitis with a 5.0 x 4.7 x 6.6 cm extraluminal collection of air and fluid. Evaluation is limited due to respiratory motion. No bowel obstruction. Normal appendix. 2. Diarrheal state. Correlation with  clinical exam and stool cultures recommended. These results were called by telephone at the time of interpretation on 11/29/2018 at 9:46 pm to Dr. Phineas SemenGRAYDON GOODMAN , who verbally acknowledged these results. Electronically Signed   By: Elgie CollardArash  Radparvar M.D.   On: 11/29/2018 21:47    Pending Labs [employer] (From admission, onward)    Start     Ordered   Signed and Held  HIV antibody (Routine Testing)  Once,   R     Signed and Held   Signed and Held  Basic metabolic panel  Daily,   R     Signed and Held   Signed and Held  Magnesium  Daily,   R     Signed and Held   Signed and Held  Phosphorus  Daily,   R     Signed and Held   Signed and Held  CBC  Daily,   R     Signed and Held          Vitals/Pain Today's Vitals   11/29/18 2115 11/29/18 2130 11/29/18 2200 11/29/18 2230  BP:  135/80 130/67 136/86  Pulse: 100  98 93  Resp:      Temp:      TempSrc:      SpO2: 98%  100% 99%  Weight:      Height:        Isolation Precautions No active isolations  Medications Medications  acetaminophen (TYLENOL) tablet 1,000 mg (1,000 mg Oral Given 11/29/18 1928)  sodium chloride 0.9 % bolus 1,000 mL (1,000 mLs Intravenous New Bag/Given 11/29/18 2105)  iohexol (OMNIPAQUE) 300 MG/ML solution 125 mL (125 mLs Intravenous Contrast Given 11/29/18 2121)  piperacillin-tazobactam (ZOSYN) IVPB 3.375 g (0 g Intravenous Stopped 11/29/18 2257)    Mobility walks Low fall risk   Focused Assessments surgical   R Recommendations: See Admitting Provider Note  Report given to:   Additional Notes:

## 2018-12-01 LAB — BASIC METABOLIC PANEL
Anion gap: 13 (ref 5–15)
BUN: 8 mg/dL (ref 6–20)
CO2: 24 mmol/L (ref 22–32)
Calcium: 8.5 mg/dL — ABNORMAL LOW (ref 8.9–10.3)
Chloride: 99 mmol/L (ref 98–111)
Creatinine, Ser: 1.08 mg/dL (ref 0.61–1.24)
GFR calc Af Amer: >60 mL/min (ref >60)
GFR calc non Af Amer: >60 mL/min (ref >60)
Glucose, Bld: 96 mg/dL (ref 70–99)
Potassium: 3.7 mmol/L (ref 3.5–5.1)
Sodium: 136 mmol/L (ref 135–145)

## 2018-12-01 LAB — CBC
HCT: 41.3 % (ref 39.0–52.0)
Hemoglobin: 13.7 g/dL (ref 13.0–17.0)
MCH: 28.1 pg (ref 26.0–34.0)
MCHC: 33.2 g/dL (ref 30.0–36.0)
MCV: 84.6 fL (ref 80.0–100.0)
Platelets: 490 10*3/uL — ABNORMAL HIGH (ref 150–400)
RBC: 4.88 MIL/uL (ref 4.22–5.81)
RDW: 14.6 % (ref 11.5–15.5)
WBC: 21.9 10*3/uL — ABNORMAL HIGH (ref 4.0–10.5)
nRBC: 0 % (ref 0.0–0.2)

## 2018-12-01 LAB — PHOSPHORUS: Phosphorus: 3.9 mg/dL (ref 2.5–4.6)

## 2018-12-01 LAB — MAGNESIUM: Magnesium: 2.3 mg/dL (ref 1.7–2.4)

## 2018-12-01 LAB — HIV ANTIBODY (ROUTINE TESTING W REFLEX): HIV Screen 4th Generation wRfx: NONREACTIVE

## 2018-12-01 NOTE — Progress Notes (Signed)
114mls emptied from the drainage bag.

## 2018-12-02 LAB — BASIC METABOLIC PANEL
Anion gap: 12 (ref 5–15)
BUN: 9 mg/dL (ref 6–20)
CO2: 24 mmol/L (ref 22–32)
Calcium: 8.7 mg/dL — ABNORMAL LOW (ref 8.9–10.3)
Chloride: 99 mmol/L (ref 98–111)
Creatinine, Ser: 0.94 mg/dL (ref 0.61–1.24)
GFR calc Af Amer: >60 mL/min (ref >60)
GFR calc non Af Amer: >60 mL/min (ref >60)
Glucose, Bld: 99 mg/dL (ref 70–99)
Potassium: 3.7 mmol/L (ref 3.5–5.1)
Sodium: 135 mmol/L (ref 135–145)

## 2018-12-02 LAB — CBC
HCT: 41.6 % (ref 39.0–52.0)
Hemoglobin: 13.7 g/dL (ref 13.0–17.0)
MCH: 28.3 pg (ref 26.0–34.0)
MCHC: 32.9 g/dL (ref 30.0–36.0)
MCV: 86 fL (ref 80.0–100.0)
Platelets: 492 10*3/uL — ABNORMAL HIGH (ref 150–400)
RBC: 4.84 MIL/uL (ref 4.22–5.81)
RDW: 14.6 % (ref 11.5–15.5)
WBC: 19.6 10*3/uL — ABNORMAL HIGH (ref 4.0–10.5)
nRBC: 0 % (ref 0.0–0.2)

## 2018-12-02 LAB — MAGNESIUM: Magnesium: 2.3 mg/dL (ref 1.7–2.4)

## 2018-12-02 LAB — PHOSPHORUS: Phosphorus: 4.4 mg/dL (ref 2.5–4.6)

## 2018-12-02 MED ORDER — ALUM & MAG HYDROXIDE-SIMETH 200-200-20 MG/5ML PO SUSP
30.0000 mL | Freq: Four times a day (QID) | ORAL | Status: DC | PRN
  Administered 2018-12-02: 30 mL via ORAL
  Filled 2018-12-02: qty 30

## 2018-12-02 NOTE — Progress Notes (Signed)
Subjective:  CC: Carl Nelson is a 28 y.o. male  Hospital stay day 3,   sigmoid diverticulitis  HPI: No acute issues. Continued improvement in lower abdominal pain, sore at drain insertion site   ROS:  General: Denies weight loss, weight gain, fatigue, fevers, chills, and night sweats. Heart: Denies chest pain, palpitations, racing heart, irregular heartbeat, leg pain or swelling, and decreased activity tolerance. Respiratory: Denies breathing difficulty, shortness of breath, wheezing, cough, and sputum. GI: Denies change in appetite, heartburn, nausea, vomiting, constipation, diarrhea, and blood in stool. GU: Denies pain with urinating, urgency, frequency, blood in urine.   Objective:   VSS     Height: 5\' 1"  (154.9 cm) Weight: 117.5 kg BMI (Calculated): 48.98   Intake/Output this shift:  125ml out from bag, voiding well.  Couple BMs, loose, overnight  Constitutional :  alert, cooperative, appears stated age and no distress  Respiratory:  clear to auscultation bilaterally  Cardiovascular:  regular rate and rhythm  Gastrointestinal: soft, no guarding, focal tenderness in LLQ minimal.   Skin: Cool and moist. IR drain with feculant drainage.  Psychiatric: Normal affect, non-agitated, not confused       LABS:  Wbc down again  RADS: n/a Assessment:   Sigmoid diverticulitis.   - s/p IR drainage.  No significant output past 24hrs.  Continue to monitor for additional drainage - soft diet today - cont IV zosyn until wbc wnl -likely d/c in next day or two once tolerating reg diet, pain controlled with oral meds, and wbc wnl

## 2018-12-02 NOTE — Plan of Care (Signed)
Pt verbalizes understanding of care plan 

## 2018-12-02 NOTE — Discharge Summary (Deleted)
Physician Discharge Summary  Patient ID: Carl Nelson MRN: [id number] DOB/AGE: 1990/08/16 28 y.o.  Admit date: 11/29/2018 Discharge date: 12/05/2018  Admission Diagnoses: Sigmoid diverticulitis  Discharge Diagnoses:  Same as above  Discharged Condition: good  Hospital Course: Admitted for above.  Status post IR drainage.  Was able to slowly advance diet after drain placement along with decreasing leukocytosis.  By time of discharge, patient tolerating diet, pain controlled drainage output minimal.  Consults: IR  Discharge Exam: Blood pressure 116/70, pulse 86, temperature 98.3 F (36.8 C), temperature source Oral, resp. rate 18, height 5\' 1"  (1.549 m), weight 117.5 kg, SpO2 95 %.   Disposition:     Allergies as of 12/04/2018   No Known Allergies     Medication List    STOP taking these medications   acetaminophen 325 MG tablet Commonly known as: TYLENOL     TAKE these medications   ciprofloxacin 500 MG tablet Commonly known as: Cipro Take 1 tablet (500 mg total) by mouth 2 (two) times daily for 10 days.   HYDROcodone-acetaminophen 5-325 MG tablet Commonly known as: Norco Take 1-2 tablets by mouth every 6 (six) hours as needed for moderate pain.   metroNIDAZOLE 500 MG tablet Commonly known as: FLAGYL Take 1 tablet (500 mg total) by mouth 3 (three) times daily for 10 days.   sodium chloride flush 0.9 % Soln Commonly known as: NS 5 mLs by Intracatheter route every 12 (twelve) hours.      Follow-up Information    Ameet Sandy, DO Follow up in 1 week(s).   Specialty: Surgery Why: please keep record of daily output from drain Contact information: [address] [place] [phone]            Total time spent arranging discharge was >48min. Signed: Benjamine Sprague 12/05/2018, 8:48 AM

## 2018-12-02 NOTE — Progress Notes (Signed)
Subjective:  CC: Carl Nelson is a 28 y.o. male  Hospital stay day 2,   sigmoid diverticulitis  HPI: No acute issues. pain in lower abdomen improving.   ROS:  General: Denies weight loss, weight gain, fatigue, fevers, chills, and night sweats. Heart: Denies chest pain, palpitations, racing heart, irregular heartbeat, leg pain or swelling, and decreased activity tolerance. Respiratory: Denies breathing difficulty, shortness of breath, wheezing, cough, and sputum. GI: Denies change in appetite, heartburn, nausea, vomiting, constipation, diarrhea, and blood in stool. GU: Denies pain with urinating, urgency, frequency, blood in urine.   Objective:   VSS     Height: 5\' 1"  (154.9 cm) Weight: 117.5 kg BMI (Calculated): 48.98   Intake/Output this shift:  15ml out from bag, voiding well.  Couple BMs, loose, overnight  Constitutional :  alert, cooperative, appears stated age and no distress  Respiratory:  clear to auscultation bilaterally  Cardiovascular:  regular rate and rhythm  Gastrointestinal: soft, no guarding, focal tenderness in LLQ improved, no RUQ pain.   Skin: Cool and moist. IR drain with feculant drainage.  Psychiatric: Normal affect, non-agitated, not confused       LABS:  Wbc down to 21, rest WNL  RADS: n/a Assessment:   Sigmoid diverticulitis.   - s/p IR drainage.  Continue to monitor for additional drainage - start slowly advancing diet - cont IV zosyn

## 2018-12-03 DIAGNOSIS — K5720 Diverticulitis of large intestine with perforation and abscess without bleeding: Principal | ICD-10-CM

## 2018-12-03 LAB — CBC
HCT: 41.8 % (ref 39.0–52.0)
Hemoglobin: 13.6 g/dL (ref 13.0–17.0)
MCH: 28 pg (ref 26.0–34.0)
MCHC: 32.5 g/dL (ref 30.0–36.0)
MCV: 86 fL (ref 80.0–100.0)
Platelets: 505 10*3/uL — ABNORMAL HIGH (ref 150–400)
RBC: 4.86 MIL/uL (ref 4.22–5.81)
RDW: 14.5 % (ref 11.5–15.5)
WBC: 18.4 10*3/uL — ABNORMAL HIGH (ref 4.0–10.5)
nRBC: 0 % (ref 0.0–0.2)

## 2018-12-03 LAB — BASIC METABOLIC PANEL
Anion gap: 9 (ref 5–15)
BUN: 8 mg/dL (ref 6–20)
CO2: 27 mmol/L (ref 22–32)
Calcium: 8.8 mg/dL — ABNORMAL LOW (ref 8.9–10.3)
Chloride: 99 mmol/L (ref 98–111)
Creatinine, Ser: 0.99 mg/dL (ref 0.61–1.24)
GFR calc Af Amer: >60 mL/min (ref >60)
GFR calc non Af Amer: >60 mL/min (ref >60)
Glucose, Bld: 100 mg/dL — ABNORMAL HIGH (ref 70–99)
Potassium: 3.7 mmol/L (ref 3.5–5.1)
Sodium: 135 mmol/L (ref 135–145)

## 2018-12-03 LAB — MAGNESIUM: Magnesium: 2.1 mg/dL (ref 1.7–2.4)

## 2018-12-03 LAB — PHOSPHORUS: Phosphorus: 4.7 mg/dL — ABNORMAL HIGH (ref 2.5–4.6)

## 2018-12-03 MED ORDER — ENOXAPARIN SODIUM 40 MG/0.4ML ~~LOC~~ SOLN
40.0000 mg | Freq: Two times a day (BID) | SUBCUTANEOUS | Status: DC
  Administered 2018-12-03: 40 mg via SUBCUTANEOUS
  Filled 2018-12-03: qty 0.4

## 2018-12-03 MED ORDER — ENOXAPARIN SODIUM 40 MG/0.4ML ~~LOC~~ SOLN: 40.0000 mg | SUBCUTANEOUS | Status: DC

## 2018-12-03 NOTE — Progress Notes (Signed)
CC: Diverticulitis Subjective: Status post drain placement.  Patient continues to feel better.  He is tolerating diet.  He did have a low-grade temperature.  White count is still elevated 18.4 this morning hemoglobin is normal.  I have personally reviewed the CT scan showing evidence of a collection. Tolerated p.o. well. Objective: Vital signs in last 24 hours: Temp:  [99.3 F (37.4 C)-100.6 F (38.1 C)] 99.3 F (37.4 C) (08/14 2309) Pulse Rate:  [84-100] 84 (08/15 0735) Resp:  [19-20] 19 (08/15 0735) BP: (118-134)/(56-85) 118/56 (08/15 0735) SpO2:  [96 %-100 %] 100 % (08/15 0735) Last BM Date: 12/03/18  Intake/Output from previous day: 08/14 0701 - 08/15 0700 In: 145.7 [IV Piggyback:145.7] Out: 300 [Urine:300] Intake/Output this shift: Total I/O In: 240 [P.O.:240] Out: 440 [Urine:400; Drains:40]  Physical exam: NAD, alert Abd: soft, mildly TTP LLQ, drain in place w purulent material.  Ext: well perfused and no edema  Lab Results: CBC  Recent Labs    12/02/18 0422 12/03/18 0426  WBC 19.6* 18.4*  HGB 13.7 13.6  HCT 41.6 41.8  PLT 492* 505*   BMET Recent Labs    12/02/18 0422 12/03/18 0426  NA 135 135  K 3.7 3.7  CL 99 99  CO2 24 27  GLUCOSE 99 100*  BUN 9 8  CREATININE 0.94 0.99  CALCIUM 8.7* 8.8*   PT/INR No results for input(s): LABPROT, INR in the last 72 hours. ABG No results for input(s): PHART, HCO3 in the last 72 hours.  Invalid input(s): PCO2, PO2  Studies/Results: No results found.  Anti-infectives: Anti-infectives (From admission, onward)   Start     Dose/Rate Route Frequency Ordered Stop   11/30/18 0600  piperacillin-tazobactam (ZOSYN) IVPB 3.375 g     3.375 g 12.5 mL/hr over 240 Minutes Intravenous Every 8 hours 11/30/18 0106     11/29/18 2200  piperacillin-tazobactam (ZOSYN) IVPB 3.375 g     3.375 g 100 mL/hr over 30 Minutes Intravenous  Once 11/29/18 2148 11/29/18 2257      Assessment/Plan: Complicated diverticulitis  responding slowly to management with antibiotics and drain placement.  He did have a low-grade temperature and the drain continues to put purulent output.  No need for surgical intervention at this time but he is not quite ready to go home more than likely he will spend the weekend here to make sure that his fever is under control and that his drain output that he improves. Continue soft diet Please note I have spent at least 35 minutes in this encounter with greater than 50% spent in coordination and counseling of his care  Caroleen Hamman, MD, [facility]  12/03/2018

## 2018-12-03 NOTE — Progress Notes (Signed)
Patient refusing Lovenox, states it burns. Educated on purpose of medication.

## 2018-12-04 LAB — AEROBIC/ANAEROBIC CULTURE W GRAM STAIN (SURGICAL/DEEP WOUND): Special Requests: NORMAL

## 2018-12-04 LAB — BASIC METABOLIC PANEL
Anion gap: 8 (ref 5–15)
BUN: 7 mg/dL (ref 6–20)
CO2: 27 mmol/L (ref 22–32)
Calcium: 9 mg/dL (ref 8.9–10.3)
Chloride: 101 mmol/L (ref 98–111)
Creatinine, Ser: 0.95 mg/dL (ref 0.61–1.24)
GFR calc Af Amer: >60 mL/min (ref >60)
GFR calc non Af Amer: >60 mL/min (ref >60)
Glucose, Bld: 126 mg/dL — ABNORMAL HIGH (ref 70–99)
Potassium: 3.8 mmol/L (ref 3.5–5.1)
Sodium: 136 mmol/L (ref 135–145)

## 2018-12-04 LAB — CBC
HCT: 43.2 % (ref 39.0–52.0)
Hemoglobin: 14 g/dL (ref 13.0–17.0)
MCH: 28.1 pg (ref 26.0–34.0)
MCHC: 32.4 g/dL (ref 30.0–36.0)
MCV: 86.6 fL (ref 80.0–100.0)
Platelets: 528 10*3/uL — ABNORMAL HIGH (ref 150–400)
RBC: 4.99 MIL/uL (ref 4.22–5.81)
RDW: 14.5 % (ref 11.5–15.5)
WBC: 15.1 10*3/uL — ABNORMAL HIGH (ref 4.0–10.5)
nRBC: 0 % (ref 0.0–0.2)

## 2018-12-04 LAB — PHOSPHORUS: Phosphorus: 4.2 mg/dL (ref 2.5–4.6)

## 2018-12-04 LAB — MAGNESIUM: Magnesium: 2 mg/dL (ref 1.7–2.4)

## 2018-12-04 MED ORDER — SODIUM CHLORIDE 0.9% FLUSH: 5.0000 mL | Freq: Two times a day (BID) | INTRAVENOUS | 20 refills | Status: AC

## 2018-12-04 MED ORDER — CIPROFLOXACIN HCL 500 MG PO TABS: 500.0000 mg | ORAL_TABLET | Freq: Two times a day (BID) | ORAL | 0 refills | Status: AC

## 2018-12-04 MED ORDER — METRONIDAZOLE 500 MG PO TABS: 500.0000 mg | ORAL_TABLET | Freq: Three times a day (TID) | ORAL | 0 refills | Status: AC

## 2018-12-04 MED ORDER — HYDROCODONE-ACETAMINOPHEN 5-325 MG PO TABS: 1.0000 | ORAL_TABLET | Freq: Four times a day (QID) | ORAL | 0 refills | Status: AC | PRN

## 2018-12-04 NOTE — Plan of Care (Signed)
  Problem: Pain Managment: Goal: General experience of comfort will improve Outcome: Progressing   Problem: Safety: Goal: Ability to remain free from injury will improve Outcome: Progressing   Problem: Education: Goal: Knowledge of General Education information will improve Description: Including pain rating scale, medication(s)/side effects and non-pharmacologic comfort measures Outcome: Progressing   Problem: Health Behavior/Discharge Planning: Goal: Ability to manage health-related needs will improve Outcome: Progressing   Problem: Clinical Measurements: Goal: Ability to maintain clinical measurements within normal limits will improve Outcome: Progressing Goal: Will remain free from infection Outcome: Progressing Goal: Diagnostic test results will improve Outcome: Progressing Goal: Respiratory complications will improve Outcome: Progressing Goal: Cardiovascular complication will be avoided Outcome: Progressing   Problem: Activity: Goal: Risk for activity intolerance will decrease Outcome: Progressing   Problem: Nutrition: Goal: Adequate nutrition will be maintained Outcome: Progressing   Problem: Coping: Goal: Level of anxiety will decrease Outcome: Progressing   Problem: Elimination: Goal: Will not experience complications related to bowel motility Outcome: Progressing Goal: Will not experience complications related to urinary retention Outcome: Progressing   Problem: Pain Managment: Goal: General experience of comfort will improve Outcome: Progressing   Problem: Safety: Goal: Ability to remain free from injury will improve Outcome: Progressing   Problem: Skin Integrity: Goal: Risk for impaired skin integrity will decrease Outcome: Progressing   

## 2018-12-04 NOTE — Progress Notes (Signed)
Discharge process complete. With mom at bedside. Patient has no further questions or concerns. He was taught how to flush his tube. Vital signs stable, PIV removed and personal items and gathered. Printed and signed prescriptions for patient in his hands. Wheelchair escort off unit by staff member. Patient is off unit Care relinquished.

## 2018-12-04 NOTE — Progress Notes (Signed)
   12/04/18 1330  Clinical Encounter Type  Visited With Patient  Visit Type Initial  Referral From Nurse  Consult/Referral To Chaplain  Spiritual Encounters  Spiritual Needs Emotional  Patient was alert and awake upon arrival. Pt was on phone talking with brother. Pt displayed no signs of pain. [place] spent several minutes building rapport. [place] completed initial spiritual care screening. Pastoral visit was appreciated. No further needs at this time.

## 2018-12-04 NOTE — Discharge Summary (Signed)
  Patient ID: Keymarion Luddy MRN: [id number] DOB/AGE: [birth year] 27 y.o.  Admit date: 11/29/2018 Discharge date: 12/04/2018   Discharge Diagnoses:  Active Problems:   Diverticulitis large intestine   Procedures: Percutanoeus draiange Diverticular Abscess  Hospital Course:   28 yo obese male Admitted with findings consistent with acute Diverticulitis and  abscess.  He was admitted to the hospital placed on IV antibiotics and interventional radiology placed a drain into the cavity obtaining purulent material.  He had significant improvement of his condition.  His diet was slowly advanced and he is white count continue to improve.  At time of discharge his white count was 15,000.  I had a lengthy discussion with the patient regarding going home versus staying 1 more day.  The patient was adamant about going home.  He also understands that he will benefit from elective sigmoid colectomy at some point in time. Drain teaching provided.  At The time of discharge the patient was ambulating,  pain was controlled.  His vital signs were stable and he was afebrile.   physical exam at discharge showed a pt  in no acute distress.  Awake and alert.  Abdomen: Soft , no tenderness, no peritonitis, drain with purulent discharge. .  Extremities well-perfused and no edema.  Condition of the patient the time of discharge was stable  Please note that I spent about 40 minutes in this encounter with greater than 50% spent in coordination and counseling of his care   Disposition:    Allergies as of 12/04/2018   No Known Allergies     Medication List    STOP taking these medications   acetaminophen 325 MG tablet Commonly known as: TYLENOL     TAKE these medications   ciprofloxacin 500 MG tablet Commonly known as: Cipro Take 1 tablet (500 mg total) by mouth 2 (two) times daily for 10 days.   HYDROcodone-acetaminophen 5-325 MG tablet Commonly known as: Norco Take 1-2 tablets by mouth every 6 (six)  hours as needed for moderate pain.   metroNIDAZOLE 500 MG tablet Commonly known as: FLAGYL Take 1 tablet (500 mg total) by mouth 3 (three) times daily for 10 days.   sodium chloride flush 0.9 % Soln Commonly known as: NS 5 mLs by Intracatheter route every 12 (twelve) hours.      Follow-up Information    Sakai, Isami, DO Follow up in 1 week(s).   Specialty: Surgery Why: please keep record of daily output from drain Contact information: [place] [address] [phone]            Caroleen Hamman, MD FACS

## 2018-12-04 NOTE — Discharge Instructions (Signed)
Drainage Catheter Placement, Care After This sheet gives you information about how to care for yourself after your procedure. Your health care provider may also give you more specific instructions. If you have problems or questions, contact your health care provider. What can I expect after the procedure? After the procedure, it is common to have:  Pain, soreness, and bruising near the area where the catheter was placed.  Drowsiness or trouble remembering things, if you were given a medicine to help you relax during the procedure (sedative). This may last for several hours while the medicine wears off. Follow these instructions at home: General instructions  Rest as told by your health care provider.  Avoid sitting for a long time without moving. Get up to take short walks every 1-2 hours. This is important to improve blood flow and breathing. Ask for help if you feel weak or unsteady.  Take over-the-counter and prescription medicines only as told by your health care provider.  If you are asked to drain fluid at home, follow instructions from your health care provider about how to do that.  Follow any dietary instructions from your health care provider. You may need to: ? Limit foods and liquids that increase or decrease how much fluid your body holds onto (retains). ? Limit how much salt (sodium) you eat. ? Eat a high-protein diet. This includes foods like meat, fish, eggs, and beans. ? Avoid alcohol.  Keep all follow-up visits as told by your health care provider. This is important. Contact a health care provider if:  You have redness, swelling, or pain around the incision area where the catheter was inserted.  You have fluid or blood coming from the insertion site.  Your insertion site feels warm to the touch.  You have pus or a bad smell coming from the insertion site.  You have trouble draining fluid.  You have trouble caring for your catheter.  You have pain, bloating,  or cramping in your abdomen. Get help right away if:  You develop a fever or chills.  You have increasing redness or increasing pain around the insertion site.  The fluid that drains out of the catheter turns cloudy or has a bad smell.  You have abdominal pain or cramping that gets worse or does not go away. Summary  You may have pain, soreness, and bruising near the area where the catheter was placed.  Weigh yourself every day. A change in weight may mean that too much fluid is building up in your body.  Follow instructions from your health care provider about how to care for the incision area. This information is not intended to replace advice given to you by your health care provider. Make sure you discuss any questions you have with your health care provider. Document Released: 03/26/2011 Document Revised: 03/08/2018 Document Reviewed: 01/01/2017 Elsevier Patient Education  2020 [employer].

## 2020-11-11 IMAGING — CT CT IMAGE GUIDED DRAINAGE BY PERCUTANEOUS CATHETER
1 of 2 series · 14 of 32 positions shown, 19 images · non-contrast
Comparison: none

CLINICAL DATA: Left lower quadrant multilocular abscess presumed
diverticular.

[Series 2: i-spiral 5.0 b30f · axial · 0.77mm/px · z∈[+364,+511]mm · 14 of 48 slices shown, 19 images]
[im 3/48  soft-tissue]
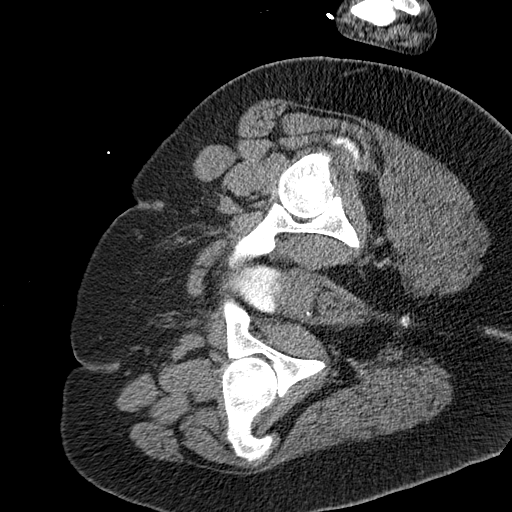
[im 3/48  bone]
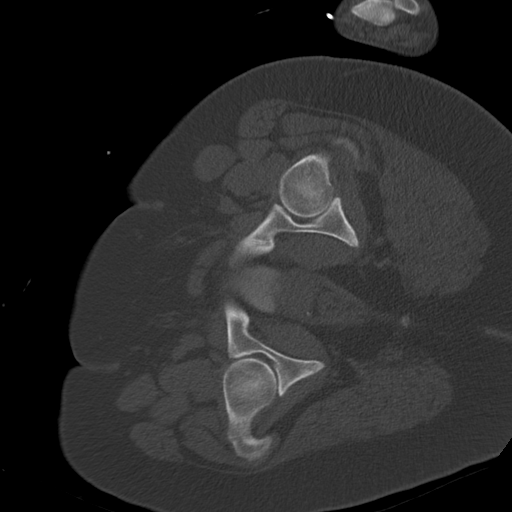
[im 6/48  soft-tissue]
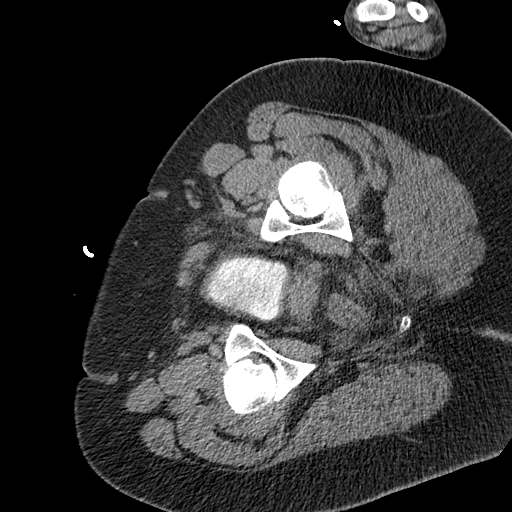
[im 12/48  soft-tissue]
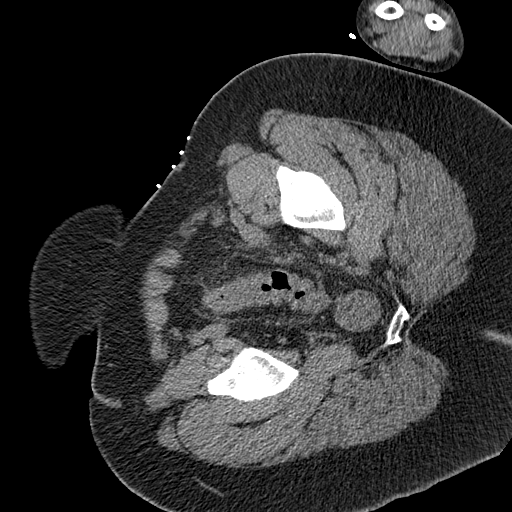
[im 14/48  soft-tissue]
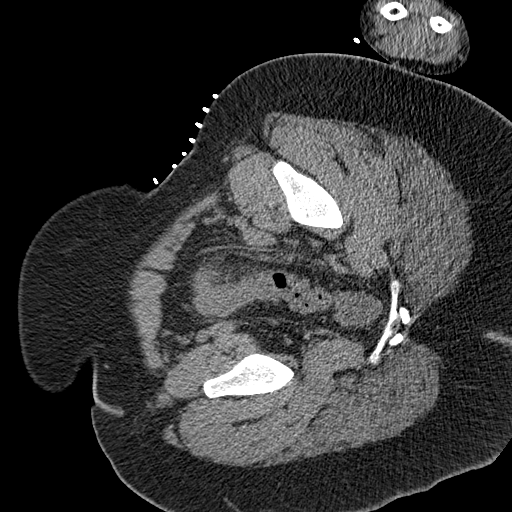
[im 17/48  soft-tissue]
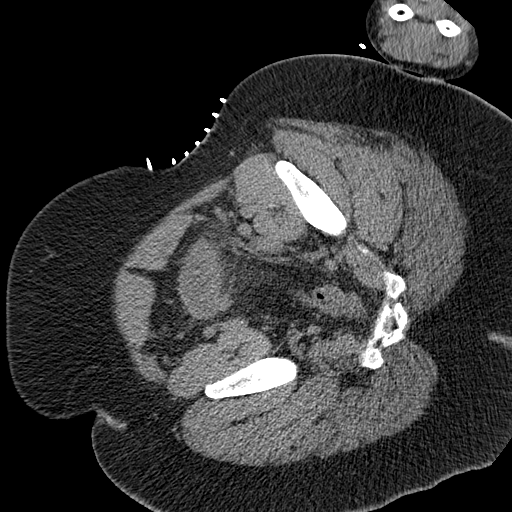
[im 20/48  soft-tissue]
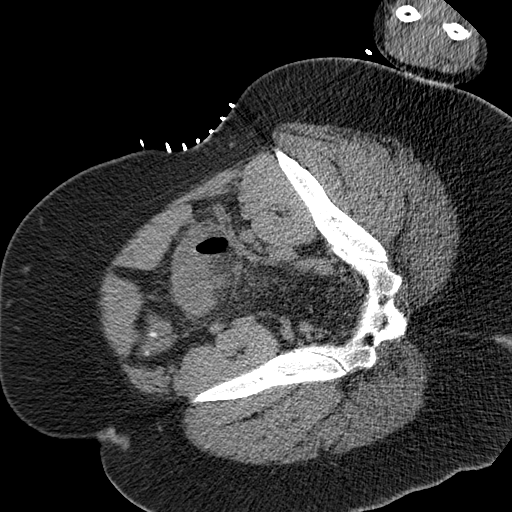
[im 25/48  soft-tissue]
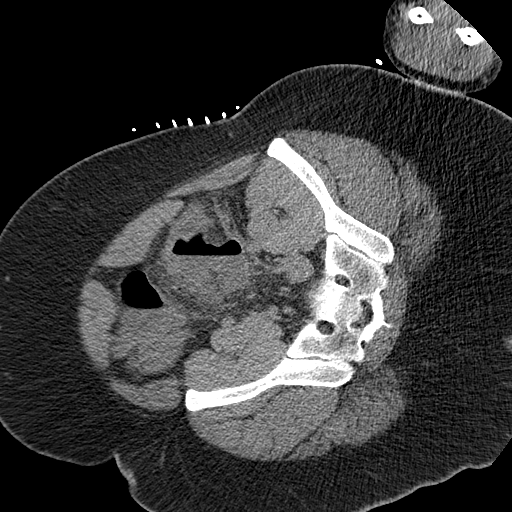
[im 28/48  soft-tissue]
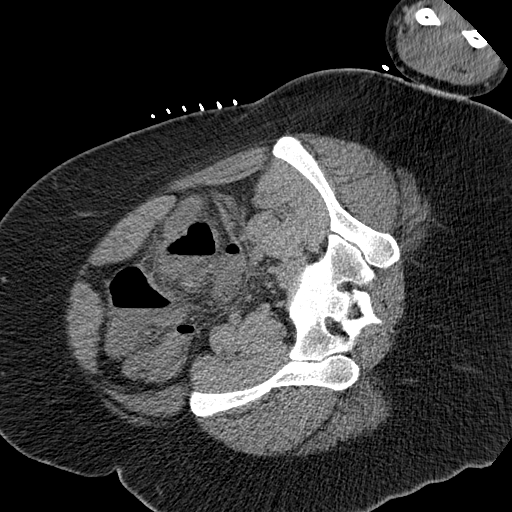
[im 31/48  soft-tissue]
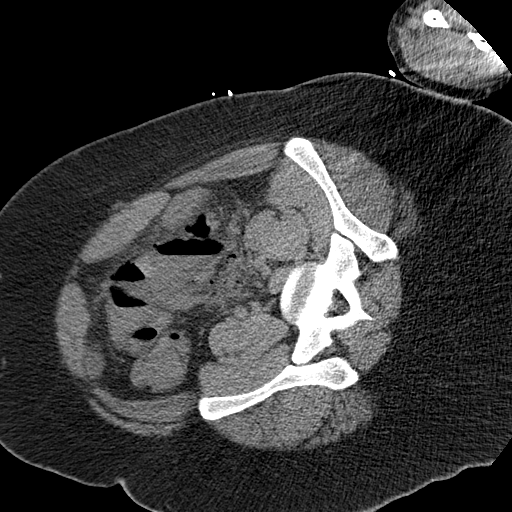
[im 31/48  bone]
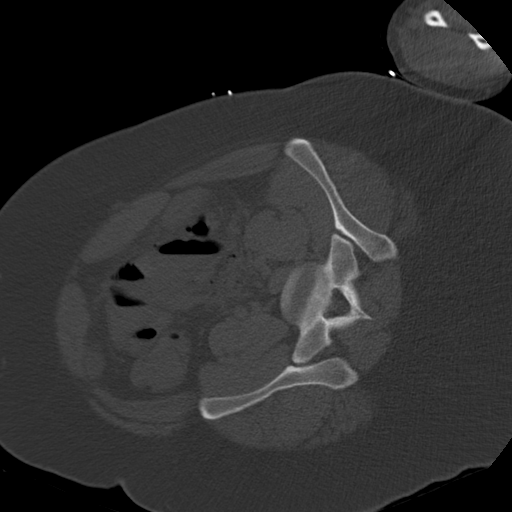
[im 34/48  soft-tissue]
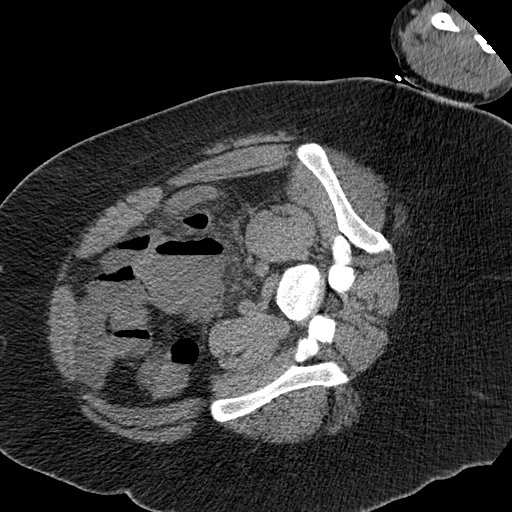
[im 36/48  soft-tissue]
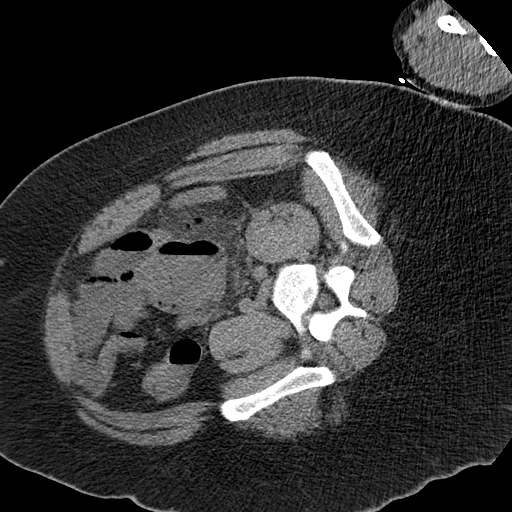
[im 36/48  lung]
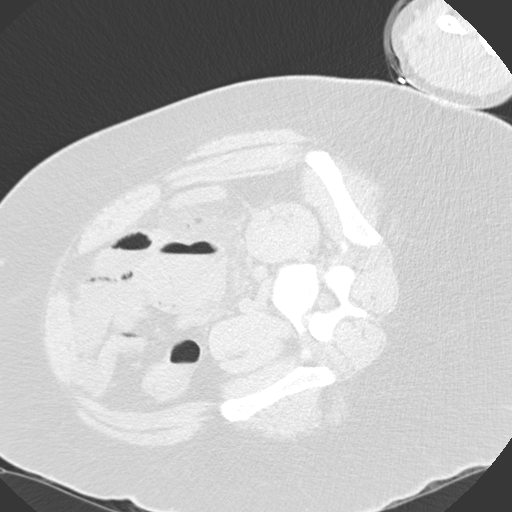
[im 39/48  lung]
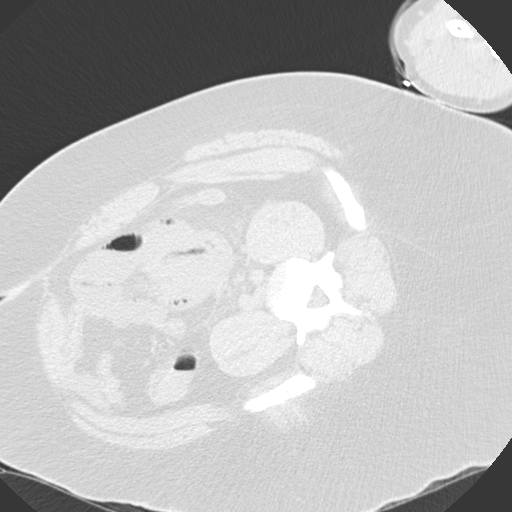
[im 42/48  soft-tissue]
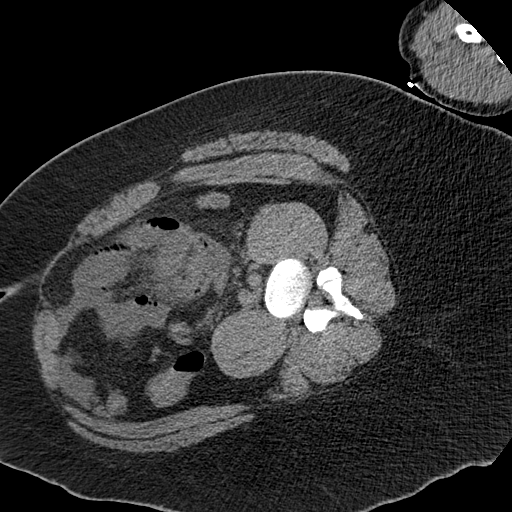
[im 42/48  lung]
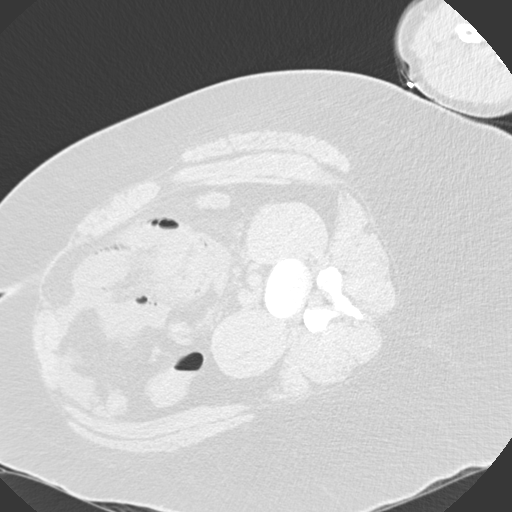
[im 45/48  soft-tissue]
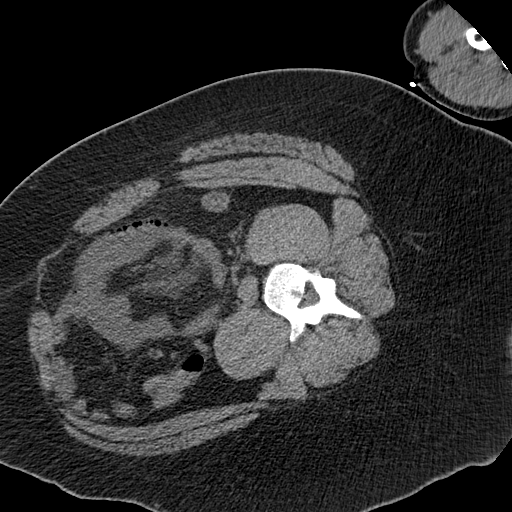
[im 45/48  lung]
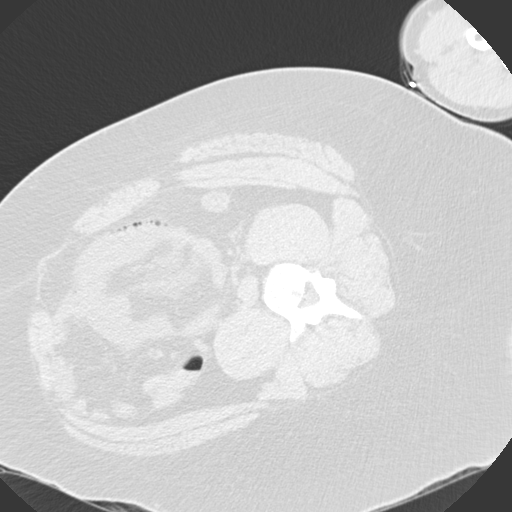

[14 of 32 positions shown; findings below may reference images not displayed]

EXAM:
CT GUIDED DRAINAGE OF LEFT LOWER QUADRANT ABSCESS

ANESTHESIA/SEDATION:
Intravenous Fentanyl 608mcg and Versed 4mg were administered as
conscious sedation during continuous monitoring of the patient's
level of consciousness and physiological / cardiorespiratory status
by the radiology RN, with a total moderate sedation time of 38
minutes.

PROCEDURE:
The procedure, risks, benefits, and alternatives were explained to
the patient. Questions regarding the procedure were encouraged and
answered. The patient understands and consents to the procedure.

Patient placed right lateral decubitus. Select axial scans through
the lower abdomen pelvis obtained. The multilocular collection was
localized and an appropriate skin entry site was determined and
marked.

The site was prepped with chlorhexidinein a sterile fashion, and a
sterile drape was applied covering the operative field. A sterile
gown and sterile gloves were used for the procedure. Local
anesthesia was provided with 1% Lidocaine.

Under CT fluoroscopic guidance, 18 gauge trocar needle advanced into
the collection. Gas spontaneously returned at the needle hub.
Amplatz guidewire advanced easily within the collection, its
position confirmed on CT. Tract dilated to facilitate placement of a
12 French pigtail drain catheter, formed within the dependent aspect
of the collection. 20 mL of grossly purulent aspirate was sent for
Gram stain and culture. Catheter secured externally with 0 Prolene
suture and StatLock and placed to gravity drain bag. The patient
tolerated the procedure well.

COMPLICATIONS:
None immediate
FINDINGS: Multiloculated left lower quadrant gas and fluid collection
localized. 12 French drain catheter placed as above. Sample of the
aspirate sent for Gram stain and culture.
IMPRESSION: 1. Technically successful CT-guided left lower quadrant abscess
drain catheter placement

## 2023-12-22 ENCOUNTER — Emergency Department
Admission: EM | Admit: 2023-12-22 | Discharge: 2023-12-22 | Disposition: A | Discharge status: Home / Self Care | Payer: Self-pay

## 2023-12-22 ENCOUNTER — Other Ambulatory Visit: Payer: Self-pay

## 2023-12-22 DIAGNOSIS — R030 Elevated blood-pressure reading, without diagnosis of hypertension: Secondary | ICD-10-CM | POA: Insufficient documentation

## 2023-12-22 DIAGNOSIS — J029 Acute pharyngitis, unspecified: Secondary | ICD-10-CM | POA: Insufficient documentation

## 2023-12-22 LAB — RESP PANEL BY RT-PCR (RSV, FLU A&B, COVID)  RVPGX2
Influenza A by PCR: NEGATIVE
Influenza B by PCR: NEGATIVE
Resp Syncytial Virus by PCR: NEGATIVE
SARS Coronavirus 2 by RT PCR: NEGATIVE

## 2023-12-22 LAB — GROUP A STREP BY PCR: Group A Strep by PCR: NOT DETECTED

## 2023-12-22 MED ORDER — LIDOCAINE VISCOUS HCL 2 % MT SOLN
15.0000 mL | Freq: Once | OROMUCOSAL | Status: AC
  Administered 2023-12-22: 15 mL via OROMUCOSAL
  Filled 2023-12-22: qty 15

## 2023-12-22 MED ORDER — LIDOCAINE VISCOUS HCL 2 % MT SOLN: 15.0000 mL | Freq: Four times a day (QID) | OROMUCOSAL | 0 refills | Status: AC | PRN

## 2023-12-22 NOTE — ED Triage Notes (Signed)
Pt reports sore throat x 3 days.  

## 2023-12-22 NOTE — Discharge Instructions (Addendum)
 You were seen in the emergency department for a viral throat infection.  Please use hot tea with honey to help with your pain.  You may also suck on hard candy to help with your pain.  I have sent in a prescription for you that will help with mouth and throat pain.  Please take it as prescribed.  You may also take ibuprofen and Tylenol  as needed to help with your pain.  Please do not take aspirin, naproxen, Motrin, Aleve while taking the ibuprofen.  Please return to the emergency department if you experience a change in your voice, drooling, unable to open your jaw, chest pain, difficulty breathing, pus coming out of your mouth or any other symptoms that concern you.  I have made a referral for you to primary care.  Please look to the resources listed in this paperwork and give one of their offices a call to establish care.  Your blood pressure is elevated today and I would like you to be reevaluated to see if you need to start blood pressure medications. Return to the Emergency Department (ED) if you experience any worsening chest pain/pressure/tightness, difficulty breathing, or sudden sweating, or other symptoms that concern you.

## 2023-12-22 NOTE — ED Provider Notes (Signed)
 [facility] Provider Note    Event Date/Time   First MD Initiated Contact with Patient 12/22/23 1950     (approximate)   History   Sore Throat   HPI  Carl Nelson is a 33 y.o. male  with no significant past medical history presents to the emergency department with sore throat x 3 days.  Patient denies fever, chills, chest pain, difficulty breathing/shortness of breath, neck pain, difficulty swallowing, drooling, pus-like discharge, jaw pain, otalgia, nausea, vomiting, change in voice.  Patient states he has been taking aspirin at home for his pain.    Physical Exam   Triage Vital Signs: ED Triage Vitals  Encounter Vitals Group     BP 12/22/23 1903 (!) 157/117     Girls Systolic BP Percentile --      Girls Diastolic BP Percentile --      Boys Systolic BP Percentile --      Boys Diastolic BP Percentile --      Pulse Rate 12/22/23 1903 68     Resp 12/22/23 1903 19     Temp 12/22/23 1903 98 F (36.7 C)     Temp Source 12/22/23 1903 Oral     SpO2 12/22/23 1903 98 %     Weight 12/22/23 1903 268 lb (121.6 kg)     Height 12/22/23 1903 5' 1 (1.549 m)     Head Circumference --      Peak Flow --      Pain Score 12/22/23 1907 6     Pain Loc --      Pain Education --      Exclude from Growth Chart --     Most recent vital signs: Vitals:   12/22/23 2002 12/22/23 2059  BP: (!) 157/110 (!) 132/91  Pulse: 68   Resp: 18   Temp: 98.2 F (36.8 C)   SpO2: 98%     General: Awake, in no acute distress. Appears stated age. Head: Normocephalic, atraumatic. Eyes: No scleral icterus or conjunctival injection. Ears/Nose/Throat: TMs intact b/l. Nares patent, no nasal discharge. Oropharynx moist, bilateral erythema, no exudates. Uvula midline. No evidence of fluctuating area. No trismus.  Neck: Supple, no lymphadenopathy, no nuchal rigidity. CV: Good peripheral perfusion. No edema.  Respiratory:Normal respiratory effort.  No respiratory distress.  CTAB. GI: Soft, non-distended. Skin:Warm, dry, intact. No rashes, lesions, or ecchymosis. No cyanosis or pallor.   ED Results / Procedures / Treatments   Labs (all labs ordered are listed, but only abnormal results are displayed) Labs Reviewed  GROUP A STREP BY PCR  RESP PANEL BY RT-PCR (RSV, FLU A&B, COVID)  [id number]     EKG     RADIOLOGY    PROCEDURES:  Critical Care performed: No   Procedures   MEDICATIONS ORDERED IN ED: Medications  lidocaine  (XYLOCAINE ) 2 % viscous mouth solution 15 mL (15 mLs Mouth/Throat Given 12/22/23 2115)     IMPRESSION / MDM / ASSESSMENT AND PLAN / ED COURSE  I reviewed the triage vital signs and the nursing notes.                              Differential diagnosis includes, but is not limited to, viral pharyngitis, strep pharyngitis, COVID, flu, RSV  Patient's presentation is most consistent with acute complicated illness / injury requiring diagnostic workup.  Patient is a 33 year old male who presented today with sore throat x 3 days.  Strep PCR negative.  Respiratory panel negative.  Patient is well-appearing, no uvula deviation, no trismus no drooling or change in voice, less concerned for peritonsillar or retropharyngeal abscess.  Able to swallow with ease.  Neurovascularly intact.  Blood pressure 157/117 on arrival, recheck is 132/91.  Patient denies any symptoms related to hypertension at this time.  Discussed symptomatic care for viral pharyngitis and prescribed viscous lidocaine  to help with his pain. Did place ambulatory referral to primary care for him to establish care and see if he needs to be started on antihypertensives.    The patient may return to the emergency department for any new, worsening, or concerning symptoms. Patient was given the opportunity to ask questions; all questions were answered. Strict emergency department return precautions were discussed with the patient.  Patient is in agreement to the treatment plan.   Patient is stable for discharge.    FINAL CLINICAL IMPRESSION(S) / ED DIAGNOSES   Final diagnoses:  Elevated blood pressure reading  Viral pharyngitis     Rx / DC Orders   ED Discharge Orders          Ordered    lidocaine  (XYLOCAINE ) 2 % solution  Every 6 hours PRN        12/22/23 2051    Ambulatory Referral to Primary Care (Establish Care)        12/22/23 2054             Note:  This document was prepared using Dragon voice recognition software and may include unintentional dictation errors.     Sheron Salm, PA-C 12/22/23 2213    Nicholaus Rolland BRAVO, MD 12/22/23 [phone]
